# Patient Record
Sex: Male | Born: 1960 | Race: White | Hispanic: No | Marital: Married | Smoking: Current every day smoker
Health system: Southern US, Community
[De-identification: ages and names within clinical notes are randomized; demographics above are authoritative.]

## PROBLEM LIST (undated history)

## (undated) DIAGNOSIS — M79671 Pain in right foot: Secondary | ICD-10-CM

## (undated) DIAGNOSIS — G8929 Other chronic pain: Secondary | ICD-10-CM

## (undated) DIAGNOSIS — E663 Overweight: Secondary | ICD-10-CM

## (undated) DIAGNOSIS — F329 Major depressive disorder, single episode, unspecified: Secondary | ICD-10-CM

## (undated) DIAGNOSIS — J449 Chronic obstructive pulmonary disease, unspecified: Secondary | ICD-10-CM

## (undated) DIAGNOSIS — M545 Low back pain, unspecified: Secondary | ICD-10-CM

## (undated) DIAGNOSIS — G4730 Sleep apnea, unspecified: Secondary | ICD-10-CM

## (undated) DIAGNOSIS — M1990 Unspecified osteoarthritis, unspecified site: Secondary | ICD-10-CM

## (undated) DIAGNOSIS — B019 Varicella without complication: Secondary | ICD-10-CM

## (undated) DIAGNOSIS — T7840XA Allergy, unspecified, initial encounter: Secondary | ICD-10-CM

## (undated) DIAGNOSIS — B354 Tinea corporis: Secondary | ICD-10-CM

## (undated) DIAGNOSIS — I10 Essential (primary) hypertension: Secondary | ICD-10-CM

## (undated) DIAGNOSIS — R5383 Other fatigue: Principal | ICD-10-CM

## (undated) DIAGNOSIS — M542 Cervicalgia: Secondary | ICD-10-CM

## (undated) DIAGNOSIS — E349 Endocrine disorder, unspecified: Secondary | ICD-10-CM

## (undated) DIAGNOSIS — F419 Anxiety disorder, unspecified: Secondary | ICD-10-CM

## (undated) DIAGNOSIS — E785 Hyperlipidemia, unspecified: Secondary | ICD-10-CM

## (undated) DIAGNOSIS — L739 Follicular disorder, unspecified: Secondary | ICD-10-CM

## (undated) DIAGNOSIS — Z720 Tobacco use: Secondary | ICD-10-CM

## (undated) DIAGNOSIS — M79672 Pain in left foot: Secondary | ICD-10-CM

## (undated) HISTORY — DX: Unspecified osteoarthritis, unspecified site: M19.90

## (undated) HISTORY — DX: Major depressive disorder, single episode, unspecified: F32.9

## (undated) HISTORY — DX: Tobacco use: Z72.0

## (undated) HISTORY — DX: Low back pain: M54.5

## (undated) HISTORY — DX: Varicella without complication: B01.9

## (undated) HISTORY — DX: Low back pain, unspecified: M54.50

## (undated) HISTORY — DX: Allergy, unspecified, initial encounter: T78.40XA

## (undated) HISTORY — DX: Follicular disorder, unspecified: L73.9

## (undated) HISTORY — DX: Anxiety disorder, unspecified: F41.9

## (undated) HISTORY — DX: Endocrine disorder, unspecified: E34.9

## (undated) HISTORY — DX: Pain in right foot: M79.671

## (undated) HISTORY — DX: Other chronic pain: G89.29

## (undated) HISTORY — DX: Other fatigue: R53.83

## (undated) HISTORY — DX: Sleep apnea, unspecified: G47.30

## (undated) HISTORY — DX: Cervicalgia: M54.2

## (undated) HISTORY — DX: Overweight: E66.3

## (undated) HISTORY — DX: Hyperlipidemia, unspecified: E78.5

## (undated) HISTORY — DX: Essential (primary) hypertension: I10

## (undated) HISTORY — DX: Chronic obstructive pulmonary disease, unspecified: J44.9

## (undated) HISTORY — DX: Tinea corporis: B35.4

## (undated) HISTORY — DX: Pain in left foot: M79.672

---

## 1999-11-10 ENCOUNTER — Ambulatory Visit (HOSPITAL_COMMUNITY): Admission: RE | Admit: 1999-11-10 | Discharge: 1999-11-10 | Payer: Self-pay | Admitting: Neurosurgery

## 1999-11-10 ENCOUNTER — Encounter: Payer: Self-pay | Admitting: Neurosurgery

## 1999-11-28 ENCOUNTER — Ambulatory Visit (HOSPITAL_COMMUNITY): Admission: RE | Admit: 1999-11-28 | Discharge: 1999-11-28 | Payer: Self-pay | Admitting: Neurosurgery

## 1999-11-28 ENCOUNTER — Encounter: Payer: Self-pay | Admitting: Neurosurgery

## 1999-12-12 ENCOUNTER — Ambulatory Visit (HOSPITAL_COMMUNITY): Admission: RE | Admit: 1999-12-12 | Discharge: 1999-12-12 | Payer: Self-pay | Admitting: Neurosurgery

## 1999-12-12 ENCOUNTER — Encounter: Payer: Self-pay | Admitting: Neurosurgery

## 2006-09-15 ENCOUNTER — Emergency Department (HOSPITAL_COMMUNITY): Admission: EM | Admit: 2006-09-15 | Discharge: 2006-09-15 | Payer: Self-pay | Admitting: Emergency Medicine

## 2011-06-14 ENCOUNTER — Telehealth: Payer: Self-pay | Admitting: Family Medicine

## 2011-06-14 ENCOUNTER — Encounter: Payer: Self-pay | Admitting: Family Medicine

## 2011-06-14 ENCOUNTER — Ambulatory Visit (INDEPENDENT_AMBULATORY_CARE_PROVIDER_SITE_OTHER): Payer: 59 | Admitting: Family Medicine

## 2011-06-14 VITALS — BP 145/95 | HR 129 | Temp 98.1°F | Ht 67.75 in | Wt 221.1 lb

## 2011-06-14 DIAGNOSIS — J449 Chronic obstructive pulmonary disease, unspecified: Secondary | ICD-10-CM

## 2011-06-14 DIAGNOSIS — Z23 Encounter for immunization: Secondary | ICD-10-CM

## 2011-06-14 DIAGNOSIS — J4489 Other specified chronic obstructive pulmonary disease: Secondary | ICD-10-CM

## 2011-06-14 DIAGNOSIS — E119 Type 2 diabetes mellitus without complications: Secondary | ICD-10-CM | POA: Insufficient documentation

## 2011-06-14 DIAGNOSIS — M545 Low back pain, unspecified: Secondary | ICD-10-CM

## 2011-06-14 DIAGNOSIS — B354 Tinea corporis: Secondary | ICD-10-CM

## 2011-06-14 DIAGNOSIS — M542 Cervicalgia: Secondary | ICD-10-CM

## 2011-06-14 DIAGNOSIS — F17200 Nicotine dependence, unspecified, uncomplicated: Secondary | ICD-10-CM

## 2011-06-14 DIAGNOSIS — E663 Overweight: Secondary | ICD-10-CM

## 2011-06-14 DIAGNOSIS — E785 Hyperlipidemia, unspecified: Secondary | ICD-10-CM

## 2011-06-14 DIAGNOSIS — Z720 Tobacco use: Secondary | ICD-10-CM

## 2011-06-14 DIAGNOSIS — G8929 Other chronic pain: Secondary | ICD-10-CM

## 2011-06-14 DIAGNOSIS — I10 Essential (primary) hypertension: Secondary | ICD-10-CM

## 2011-06-14 DIAGNOSIS — E559 Vitamin D deficiency, unspecified: Secondary | ICD-10-CM | POA: Insufficient documentation

## 2011-06-14 DIAGNOSIS — T7840XA Allergy, unspecified, initial encounter: Secondary | ICD-10-CM

## 2011-06-14 HISTORY — DX: Overweight: E66.3

## 2011-06-14 HISTORY — DX: Tobacco use: Z72.0

## 2011-06-14 HISTORY — DX: Low back pain, unspecified: M54.50

## 2011-06-14 HISTORY — DX: Chronic obstructive pulmonary disease, unspecified: J44.9

## 2011-06-14 HISTORY — DX: Essential (primary) hypertension: I10

## 2011-06-14 HISTORY — DX: Other chronic pain: G89.29

## 2011-06-14 HISTORY — DX: Allergy, unspecified, initial encounter: T78.40XA

## 2011-06-14 MED ORDER — CARISOPRODOL 350 MG PO TABS: 350.0000 mg | ORAL_TABLET | Freq: Three times a day (TID) | ORAL | Status: AC | PRN

## 2011-06-14 MED ORDER — OXYCODONE-ACETAMINOPHEN 10-325 MG PO TABS: 1.0000 | ORAL_TABLET | Freq: Four times a day (QID) | ORAL | Status: DC | PRN

## 2011-06-14 MED ORDER — METOPROLOL SUCCINATE ER 25 MG PO TB24: 25.0000 mg | ORAL_TABLET | Freq: Every day | ORAL | Status: DC

## 2011-06-14 NOTE — Patient Instructions (Signed)
Preventative Care for Adults, Male A healthy lifestyle and preventative care can promote health and wellness. Preventative health guidelines for men include the following key practices:  A routine yearly physical is a good way to check with your caregiver about your health and preventative screening. It is a chance to share any concerns and updates on your health, and to receive a thorough exam.   Visit your dentist for a routine exam and preventative care every 6 months. Brush your teeth twice a day and floss once a day. Good oral hygiene prevents tooth decay and gum disease.   The frequency of eye exams is based on your age, health, family medical history, use of contact lenses, and other factors. Follow your caregiver's recommendations for frequency of eye exams.   Eat a healthy diet. Foods like vegetables, fruits, whole grains, low-fat dairy products, and lean protein foods contain the nutrients you need without too many calories. Decrease your intake of foods high in solid fats, added sugars, and salt. Eat the right amount of calories for you.Get information about a proper diet from your caregiver, if necessary.   Regular physical exercise is one of the most important things you can do for your health. Most adults should get at least 150 minutes of moderate-intensity exercise (any activity that increases your heart rate and causes you to sweat) each week. In addition, most adults need muscle-strengthening exercises on 2 or more days a week.   Maintain a healthy weight. The body mass index (BMI) is a screening tool to identify possible weight problems. It provides an estimate of body fat based on height and weight. Your caregiver can help determine your BMI, and can help you achieve or maintain a healthy weight.For adults 20 years and older:   A BMI below 18.5 is considered underweight.   A BMI of 18.5 to 24.9 is normal.   A BMI of 25 to 29.9 is considered overweight.   A BMI of 30 and  above is considered obese.   Maintain normal blood lipids and cholesterol levels by exercising and minimizing your intake of saturated fat. Eat a balanced diet with plenty of fruit and vegetables. Blood tests for lipids and cholesterol should begin at age 28 and be repeated every 5 years. If your lipid or cholesterol levels are high, you are over 50, or you are a high risk for heart disease, you may need your cholesterol levels checked more frequently.Ongoing high lipid and cholesterol levels should be treated with medicines if diet and exercise are not effective.   If you smoke, find out from your caregiver how to quit. If you do not use tobacco, do not start.   If you choose to drink alcohol, do not exceed 2 drinks per day. One drink is considered to be 12 ounces (355 mL) of beer, 5 ounces (148 mL) of wine, or 1.5 ounces (44 mL) of liquor.   Avoid use of street drugs. Do not share needles with anyone. Ask for help if you need support or instructions about stopping the use of drugs.   High blood pressure causes heart disease and increases the risk of stroke. Your blood pressure should be checked at least every 1 to 2 years. Ongoing high blood pressure should be treated with medicines, if weight loss and exercise are not effective.   If you are 75 to 49 years old, ask your caregiver if you should take aspirin to prevent heart disease.   Diabetes screening involves taking a blood  sample to check your fasting blood sugar level. This should be done once every 3 years, after age 9, if you are within normal weight and without risk factors for diabetes. Testing should be considered at a younger age or be carried out more frequently if you are overweight and have at least 1 risk factor for diabetes.   Colorectal cancer can be detected and often prevented. Most routine colorectal cancer screening begins at the age of 72 and continues through age 66. However, your caregiver may recommend screening at an  earlier age if you have risk factors for colon cancer. On a yearly basis, your caregiver may provide home test kits to check for hidden blood in the stool. Use of a small camera at the end of a tube, to directly examine the colon (sigmoidoscopy or colonoscopy), can detect the earliest forms of colorectal cancer. Talk to your caregiver about this at age 64, when routine screening begins. Direct examination of the colon should be repeated every 5 to 10 years through age 28, unless early forms of pre-cancerous polyps or small growths are found.   Practice safe sex. Use condoms and avoid high-risk sexual practices to reduce the spread of sexually transmitted infections (STIs). STIs include gonorrhea, chlamydia, syphilis, trichomonas, herpes, HPV, and human immunodeficiency virus (HIV). Herpes, HIV, and HPV are viral illnesses that have no cure. They can result in disability, cancer, and death.   A one-time screening for abdominal aortic aneurysm (AAA) and surgical repair of large AAAs by sound wave imaging (ultrasonography) is recommended for ages 37 to 51 years who are current or former smokers.   Healthy men should no longer receive prostate-specific antigen (PSA) blood tests as part of routine cancer screening. Consult with your caregiver about prostate cancer screening.   Use sunscreen with skin protection factor (SPF) of 30 or more. Apply sunscreen liberally and repeatedly throughout the day. You should seek shade when your shadow is shorter than you. Protect yourself by wearing long sleeves, pants, a wide-brimmed hat, and sunglasses year round, whenever you are outdoors.   Once a month, do a whole body skin exam, using a mirror to look at the skin on your back. Notify your caregiver of new moles, moles that have irregular borders, moles that are larger than a pencil eraser, or moles that have changed in shape or color.   Stay current with required immunizations.   Influenza. You need a dose every  fall (or winter). The composition of the flu vaccine changes each year, so being vaccinated once is not enough.   Pneumococcal polysaccharide. You need 1 to 2 doses if you smoke cigarettes or if you have certain chronic medical conditions. You need 1 dose at age 80 (or older) if you have never been vaccinated.   Tetanus, diphtheria, pertussis (Tdap, Td). Get 1 dose of Tdap vaccine if you are younger than age 95 years, are over 17 and have contact with an infant, are a Research scientist (physical sciences), or simply want to be protected from whooping cough. After that, you need a Td booster dose every 10 years. Consult your caregiver if you have not had at least 3 tetanus and diphtheria-containing shots sometime in your life or have a deep or dirty wound.   HPV. This vaccine is recommended for males 13 through 49 years of age. This vaccine may be given to men 22 through 49 years of age who have not completed the 3 dose series. It is recommended for men through age 48  who have sex with men or whose immune system is weakened because of HIV infection, other illness, or medications. The vaccine is given in 3 doses over 6 months.   Measles, mumps, rubella (MMR). You need at least 1 dose of MMR if you were born in 1957 or later. You may also need a 2nd dose.   Meningococcal. If you are age 33 to 73 years and a Orthoptist living in a residence hall, or have one of several medical conditions, you need to get vaccinated against meningococcal disease. You may also need additional booster doses.   Zoster (shingles). If you are age 26 years or older, you should get this vaccine.   Varicella (chickenpox). If you have never had chickenpox or you were vaccinated but received only 1 dose, talk to your caregiver to find out if you need this vaccine.   Hepatitis A. You need this vaccine if you have a specific risk factor for hepatitis A virus infection, or you simply wish to be protected from this disease. The vaccine is  usually given as 2 doses, 6 to 18 months apart.   Hepatitis B. You need this vaccine if you have a specific risk factor for hepatitis B virus infection or you simply wish to be protected from this disease. The vaccine is given in 3 doses, usually over 6 months.  Preventative Service / Frequency Ages 80 to 50  Blood pressure check.** / Every 1 to 2 years.   Lipid and cholesterol check.**/ Every 5 years beginning at age 70.   Skin self-exam. / Monthly.   Influenza immunization.** / Every year.   Pneumococcal polysaccharide immunization.** / 1 to 2 doses if you smoke cigarettes or if you have certain chronic medical conditions.   Tetanus, diphtheria, pertussis (Tdap,Td) immunization. / A one-time dose of Tdap vaccine. After that, you need a Td booster dose every 10 years.   HPV immunization. / 3 doses over 6 months, if 26 and younger.   Measles, mumps, rubella (MMR) immunization. / You need at least 1 dose of MMR if you were born in 1957 or later. You may also need a 2nd dose.   Meningococcal immunization. / 1 dose if you are age 64 to 39 years and a Orthoptist living in a residence hall, or have one of several medical conditions, you need to get vaccinated against meningococcal disease. You may also need additional booster doses.   Varicella immunization. **/ Consult your caregiver.   Hepatitis A immunization. ** / Consult your caregiver. 2 doses, 6 to 18 months apart.   Hepatitis B immunization.** / Consult your caregiver. 3 doses usually over 6 months.  Ages 42 to 63  Blood pressure check.** / Every 1 to 2 years.   Lipid and cholesterol check.**/ Every 5 years beginning at age 58.   Fecal occult blood test (FOBT) of stool. / Every year beginning at age 64 and continuing until age 64. You may not have to do this test if you get colonoscopy every 10 years.   Flexible sigmoidoscopy** or colonoscopy.** / Every 5 years for a flexible sigmoidoscopy or every 10 years for  a colonoscopy beginning at age 70 and continuing until age 38.   Skin self-exam. / Monthly.   Influenza immunization.** / Every year.   Pneumococcal polysaccharide immunization.** / 1 to 2 doses if you smoke cigarettes or if you have certain chronic medical conditions.   Tetanus, diphtheria, pertussis (Tdap/Td) immunization.** / A one-time dose of  Tdap vaccine. After that, you need a Td booster dose every 10 years.   Measles, mumps, rubella (MMR) immunization. / You need at least 1 dose of MMR if you were born in 1957 or later. You may also need a 2nd dose.   Varicella immunization. **/ Consult your caregiver.   Meningococcal immunization.** / Consult your caregiver.   Hepatitis A immunization. ** / Consult your caregiver. 2 doses, 6 to 18 months apart.   Hepatitis B immunization.** / Consult your caregiver. 3 doses, usually over 6 months.  Ages 75 and over  Blood pressure check.** / Every 1 to 2 years.   Lipid and cholesterol check.**/ Every 5 years beginning at age 40.   Fecal occult blood test (FOBT) of stool. / Every year beginning at age 38 and continuing until age 30. You may not have to do this test if you get colonoscopy every 10 years.   Flexible sigmoidoscopy** or colonoscopy.** / Every 5 years for a flexible sigmoidoscopy or every 10 years for a colonoscopy beginning at age 89 and continuing until age 48.   Abdominal aortic aneurysm (AAA) screening.** / A one-time screening for ages 79 to 74 years who are current or former smokers.   Skin self-exam. / Monthly.   Influenza immunization.** / Every year.   Pneumococcal polysaccharide immunization.** / 1 dose at age 49 (or older) if you have never been vaccinated.   Tetanus, diphtheria, pertussis (Tdap, Td) immunization. / A one-time dose of Tdap vaccine if you are over 65 and have contact with an infant, are a Research scientist (physical sciences), or simply want to be protected from whooping cough. After that, you need a Td booster dose  every 10 years.   Varicella immunization. **/ Consult your caregiver.   Meningococcal immunization.** / Consult your caregiver.   Hepatitis A immunization. ** / Consult your caregiver. 2 doses, 6 to 18 months apart.   Hepatitis B immunization.** / Check with your caregiver. 3 doses, usually over 6 months.  **Family history and personal history of risk and conditions may change your caregiver's recommendations. Document Released: 09/26/2001 Document Revised: 04/12/2011 Document Reviewed: 12/26/2010 [facility] Patient Information 2012 [place], [place].  Try MegaRed caps instead of plain fish oil daily.  Try ice and aspercreme for knee pain  Minimize caffeine

## 2011-06-14 NOTE — Telephone Encounter (Signed)
Please advise 

## 2011-06-15 NOTE — Telephone Encounter (Signed)
Fine with me to provide him with requested note.

## 2011-06-15 NOTE — Telephone Encounter (Signed)
Letter faxed and left a detailed message on patients cell phone

## 2011-06-20 ENCOUNTER — Encounter: Payer: Self-pay | Admitting: Family Medicine

## 2011-06-20 DIAGNOSIS — B354 Tinea corporis: Secondary | ICD-10-CM

## 2011-06-20 HISTORY — DX: Tinea corporis: B35.4

## 2011-06-20 MED ORDER — FLUCONAZOLE 150 MG PO TABS: ORAL_TABLET | ORAL | Status: DC

## 2011-06-20 MED ORDER — KETOCONAZOLE 2 % EX SHAM: MEDICATED_SHAMPOO | CUTANEOUS | Status: AC

## 2011-06-20 NOTE — Progress Notes (Signed)
CAMEO SHEWELL [id number] [birth year] 06/20/2011      Progress Note New Patient  Subjective  Chief Complaint  Chief Complaint  Patient presents with  . Establish Care    new patient    HPI  Patient is a 50 year old Caucasian male who is in today for new patient appointment. He has been struggling with back pain for over 10 years. MRI in 1999 revealed degenerative disc disease bulging discs in both his neck and his low back. Arthritic changes were also noted. 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 Recently the pain is worsened and he has been in the ER couple times to receive medications he is here to establish care and to receive a referral to pain management for further assistance. He gets some temporary relief from ibuprofen and Percocet but symptoms recurred. He gets some temporary relief by lying on his side on the left. His radicular pain down his left arm as well as some radicular symptoms down both legs. No incontinence. No recent illness, fevers, chills, chest pain, palpitations, shortness of breath. He has a rash on her trunk which has been present for years it is pruritic.  Past Medical History  Diagnosis Date  . Allergy     seasonal  . Diabetes mellitus 7-12  . Hypertension   . Chronic lower back pain   . Chicken pox as a teenager  . Arthritis   . Hyperlipidemia   . Low back pain 06/14/2011  . Neck pain, chronic 06/14/2011  . Overweight 06/14/2011  . HTN (hypertension) 06/14/2011  . Tobacco abuse 06/14/2011  . 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 Diabetes mellitus 06/14/2011  . Allergic state 06/14/2011  . Vitamin D deficiency   . Tinea corporis 06/20/2011    History reviewed. No pertinent past surgical history.  Family History  Problem Relation Age of Onset  . Hyperlipidemia Mother   . Diabetes Mother     type 2  . Thyroid disease  Mother   . Arthritis Mother     crippling  . Hyperlipidemia Father   . Cancer Father     gum/ smoked cigars  . Arthritis Sister     rheumatoid  . Other Sister     heart arrythmia  . Heart disease Sister   . Other Sister     bad back  . Arthritis Sister     chronic    History   Social History  . Marital Status: Married    Spouse Name: N/A    Number of Children: N/A  . Years of Education: N/A   Occupational History  . Not on file.   Social History Main Topics  . Smoking status: Current Everyday Smoker -- 1.0 packs/day for 30 years    Types: Cigarettes  . Smokeless tobacco: Never Used  . Alcohol Use: Yes     12 pack of beer a week  . Drug Use: No  . Sexually Active: Yes -- Male partner(s)   Other Topics Concern  . Not on file   Social History Narrative  . No narrative on file    No current outpatient prescriptions on file prior to visit.    No Known Allergies  Review of Systems  Review of Systems  Constitutional: Negative for fever, chills and malaise/fatigue.  HENT: Negative for hearing loss, nosebleeds and congestion.        [facility] with [facility]  Eyes: Negative for discharge.  Respiratory: Negative for cough, sputum production, shortness of breath and wheezing.   Cardiovascular: Negative for chest pain, palpitations and leg swelling.  Gastrointestinal: Negative for heartburn, nausea, vomiting, abdominal pain, diarrhea, constipation and blood in stool.  Genitourinary: Negative for dysuria, urgency, frequency and hematuria.  Musculoskeletal: Positive for back pain and joint pain. Negative for myalgias and falls.       Pain is worsening in low back, he can get some relief if he lies on his left side but the relief is temporary. He has pain that radiates down his left arm from his neck.   Skin: Positive for itching and rash.       On trunk  Neurological: Negative for dizziness, tremors, sensory change,  focal weakness, loss of consciousness, weakness and headaches.  Endo/Heme/Allergies: Negative for polydipsia. Does not bruise/bleed easily.  Psychiatric/Behavioral: Negative for depression and suicidal ideas. The patient is not nervous/anxious and does not have insomnia.     Objective  BP 145/95  Pulse 129  Temp(Src) 98.1 F (36.7 C) (Oral)  Ht 5' 7.75" (1.721 m)  Wt 221 lb 1.9 oz (100.299 kg)  BMI 33.87 kg/m2  SpO2 98%  Physical Exam  Physical Exam  Constitutional: He is oriented to person, place, and time and well-developed, well-nourished, and in no distress. No distress.  HENT:  Head: Normocephalic and atraumatic.  Eyes: Conjunctivae are normal.  Neck: Neck supple. No thyromegaly present.  Cardiovascular: Normal rate, regular rhythm and normal heart sounds.   No murmur heard. Pulmonary/Chest: Effort normal and breath sounds normal. No respiratory distress.  Abdominal: He exhibits no distension and no mass. There is no tenderness.  Musculoskeletal: He exhibits tenderness. He exhibits no edema.       Pain with palp over lumbosacral spine, along paravertebral muscles.  Neurological: He is alert and oriented to person, place, and time.  Skin: Skin is warm. Rash noted. There is erythema.       Rash is diffuse, raised and scaly on his back  Psychiatric: Memory, affect and judgment normal.       Assessment & Plan  Low back pain Patient with long history of low back pain, he reports chronic low back pain and an MRI in 1999 showing DDD, 2 bugling discs and arthritic changes. He also reprots neck pain and DDD in his neck. He has had a steroid injection in his low back in past was helpful. He has been seeking crisis pain management in the ER recently and is here today to establish care and is requesting a referral to pain management to an office near his home. He is given a refill on his percocet to use sparingly. He is also giving him some Soma to use prn as well.  HTN  (hypertension) Elevated today. Encouraged to minimize his sodium intake and we will reassess at his next visit.   Diabetes mellitus Taking Metformin and no c/o concerning symptoms, will monitor hgba1c with patient. Minimize simple carbs.  Tobacco abuse Smoking 1 ppd per day encouraged to try to cut back and quit.  Overweight Consider dash diet and minimize po intake and simple carbs  Tinea corporis Has had trouble with intermittent pruritic rash on his back for years. Has responded partially to antifungals in past. Will try dual treament with Diflucan and Nizoral shampoo

## 2011-06-20 NOTE — Assessment & Plan Note (Signed)
Has had trouble with intermittent pruritic rash on his back for years. Has responded partially to antifungals in past. Will try dual treament with Diflucan and Nizoral shampoo

## 2011-06-20 NOTE — Assessment & Plan Note (Signed)
Smoking 1 ppd per day encouraged to try to cut back and quit.

## 2011-06-20 NOTE — Assessment & Plan Note (Signed)
Elevated today. Encouraged to minimize his sodium intake and we will reassess at his next visit.

## 2011-06-20 NOTE — Assessment & Plan Note (Signed)
Taking Metformin and no c/o concerning symptoms, will monitor hgba1c with patient. Minimize simple carbs.

## 2011-06-20 NOTE — Assessment & Plan Note (Signed)
Patient with long history of low back pain, he reports chronic low back pain and an MRI in 1999 showing DDD, 2 bugling discs and arthritic changes. He also reprots neck pain and DDD in his neck. He has had a steroid injection in his low back in past was helpful. He has been seeking crisis pain management in the ER recently and is here today to establish care and is requesting a referral to pain management to an office near his home. He is given a refill on his percocet to use sparingly. He is also giving him some Soma to use prn as well.

## 2011-06-20 NOTE — Assessment & Plan Note (Signed)
Consider dash diet and minimize po intake and simple carbs

## 2011-07-10 ENCOUNTER — Other Ambulatory Visit: Payer: Self-pay | Admitting: Family Medicine

## 2011-07-10 DIAGNOSIS — M545 Low back pain: Secondary | ICD-10-CM

## 2011-07-10 DIAGNOSIS — G8929 Other chronic pain: Secondary | ICD-10-CM

## 2011-07-10 MED ORDER — OXYCODONE-ACETAMINOPHEN 10-325 MG PO TABS: 1.0000 | ORAL_TABLET | Freq: Four times a day (QID) | ORAL | Status: DC | PRN

## 2011-07-10 NOTE — Telephone Encounter (Signed)
Pt informed that RX is ready to pick up.

## 2011-07-10 NOTE — Telephone Encounter (Signed)
Please advise refill on Percocet?

## 2011-07-13 ENCOUNTER — Ambulatory Visit (INDEPENDENT_AMBULATORY_CARE_PROVIDER_SITE_OTHER): Payer: 59 | Admitting: Family Medicine

## 2011-07-13 ENCOUNTER — Encounter: Payer: Self-pay | Admitting: Family Medicine

## 2011-07-13 DIAGNOSIS — R5383 Other fatigue: Secondary | ICD-10-CM | POA: Insufficient documentation

## 2011-07-13 DIAGNOSIS — L739 Follicular disorder, unspecified: Secondary | ICD-10-CM

## 2011-07-13 DIAGNOSIS — R0681 Apnea, not elsewhere classified: Secondary | ICD-10-CM

## 2011-07-13 DIAGNOSIS — B354 Tinea corporis: Secondary | ICD-10-CM

## 2011-07-13 DIAGNOSIS — F17200 Nicotine dependence, unspecified, uncomplicated: Secondary | ICD-10-CM

## 2011-07-13 DIAGNOSIS — J449 Chronic obstructive pulmonary disease, unspecified: Secondary | ICD-10-CM

## 2011-07-13 DIAGNOSIS — L738 Other specified follicular disorders: Secondary | ICD-10-CM

## 2011-07-13 DIAGNOSIS — Z720 Tobacco use: Secondary | ICD-10-CM

## 2011-07-13 DIAGNOSIS — E119 Type 2 diabetes mellitus without complications: Secondary | ICD-10-CM

## 2011-07-13 DIAGNOSIS — I10 Essential (primary) hypertension: Secondary | ICD-10-CM

## 2011-07-13 DIAGNOSIS — M545 Low back pain: Secondary | ICD-10-CM

## 2011-07-13 HISTORY — DX: Other fatigue: R53.83

## 2011-07-13 HISTORY — DX: Follicular disorder, unspecified: L73.9

## 2011-07-13 MED ORDER — DOXYCYCLINE HYCLATE 100 MG PO TABS: 100.0000 mg | ORAL_TABLET | Freq: Two times a day (BID) | ORAL | Status: AC

## 2011-07-13 NOTE — Assessment & Plan Note (Signed)
Snoring, witnessed apnea, weight gain, HTN. Patient agrees to an in home sleep study for further evaluation

## 2011-07-13 NOTE — Progress Notes (Signed)
Scott Sosa [id number] [birth year] 07/13/2011      Progress Note-Follow Up  Subjective  Chief Complaint  Chief Complaint  Patient presents with  . Follow-up    1 month    HPI  Patient is a 50 yo caucasian male in today for follow up on medical problems. He has been checking his blood sugars in the 2 clusters fasting are ranging from 109-126. He denies polyuria or polydipsia. He has an eye exam scheduled for this week and will have results forwarded. 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 Etiologies being excessively fatigued and is trying to getting hours of sleep. He also now is waking frequently and snoring excessively. His wife is also noting that he sometimes stops breathing. He's never had a sleep study evaluation thus far. Reports his blood pressure before coming in today was 125/86. Reports the rash on his back is largely resolved with Nizoral and Diflucan but he does have 1 persistent irritated itching lesion in the center of his back which is roughly located rigidity quite several months ago. No fevers, chills, chest pain, palpitations, shortness of breath, headache, GI or GU complaints. He has been struggling with back pain for many years he has been allowed a Percocet about 2-3 tablets daily and has managed to keep it at work. Does not cause sedation. He's not getting worse since starting this medication. He has been using tears overall her sleep and that has been helpful as well. He has an appointment with pain management on 07/17/2011 for further evaluation.  Past Medical History  Diagnosis Date  . Allergy     seasonal  . Diabetes mellitus 7-12  . Hypertension   . Chronic lower back pain   . Chicken pox as a teenager  . Arthritis   . Hyperlipidemia   . Low back pain 06/14/2011  . Neck pain, chronic 06/14/2011  . Overweight 06/14/2011  . HTN (hypertension) 06/14/2011  . Tobacco abuse 06/14/2011  . 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 Diabetes  mellitus 06/14/2011  . Allergic state 06/14/2011  . Vitamin D deficiency   . Tinea corporis 06/20/2011     Family History  Problem Relation Age of Onset  . Hyperlipidemia Mother   . Diabetes Mother     type 2  . Thyroid disease Mother   . Arthritis Mother     crippling  . Hyperlipidemia Father   . Cancer Father     gum/ smoked cigars  . Arthritis Sister     rheumatoid  . Other Sister     heart arrythmia  . Heart disease Sister   . Other Sister     bad back  . Arthritis Sister     chronic    History   Social History  . Marital Status: Married    Spouse Name: N/A    Number of Children: N/A  . Years of Education: N/A   Occupational History  . Not on file.   Social History Main Topics  . Smoking status: Current Everyday Smoker -- 1.0 packs/day for 30 years    Types: Cigarettes  . Smokeless tobacco: Never Used  . Alcohol Use: Yes     12 pack of beer a week  . Drug Use: No  . Sexually Active: Yes -- Male partner(s)   Other Topics Concern  . Not on file   Social History Narrative  . No narrative on file    Current Outpatient Prescriptions on File Prior  to Visit  Medication Sig Dispense Refill  . amLODipine (NORVASC) 10 MG tablet Take 5 mg by mouth daily.       . Cholecalciferol 1000 UNITS tablet Take 1,000 Units by mouth daily.        Marland Kitchen ketoconazole (NIZORAL) 2 % shampoo Apply topically 2 (two) times a week. To affected areas  120 mL  1  . lisinopril-hydrochlorothiazide (PRINZIDE,ZESTORETIC) 20-25 MG per tablet Take 1 tablet by mouth daily.        Marland Kitchen loratadine (CLARITIN) 10 MG tablet Take 10 mg by mouth daily.        . metFORMIN (GLUCOPHAGE) 500 MG tablet Take 500 mg by mouth 2 (two) times daily with a meal.        . metoprolol succinate (TOPROL XL) 25 MG 24 hr tablet Take 1 tablet (25 mg total) by mouth daily.  30 tablet  11  . niacin (SLO-NIACIN) 500 MG tablet Take 1,000 mg by mouth at bedtime.       Marland Kitchen oxyCODONE-acetaminophen (PERCOCET) 10-325 MG per  tablet Take 1 tablet by mouth every 6 (six) hours as needed for pain.  60 tablet  0    No Known Allergies  Review of Systems  Review of Systems  Constitutional: Positive for malaise/fatigue. Negative for fever.  HENT: Negative for congestion.   Eyes: Negative for discharge.  Respiratory: Negative for shortness of breath.   Cardiovascular: Negative for chest pain, palpitations and leg swelling.  Gastrointestinal: Negative for nausea, abdominal pain and diarrhea.  Genitourinary: Negative for dysuria.  Musculoskeletal: Negative for falls.  Skin: Positive for itching and rash.       Tinea corporis on back nearly resolved, but irritated, itchy lesion in middle of back is present for several months s/p a tick bite  Neurological: Negative for loss of consciousness and headaches.  Endo/Heme/Allergies: Negative for polydipsia.  Psychiatric/Behavioral: Negative for depression and suicidal ideas. The patient has insomnia. The patient is not nervous/anxious.     Objective  BP 148/97  Pulse 95  Ht 5' 7.75" (1.721 m)  Wt 226 lb (102.513 kg)  BMI 34.62 kg/m2  SpO2 97%  Physical Exam  Physical Exam  Constitutional: He is oriented to person, place, and time and well-developed, well-nourished, and in no distress. No distress.  HENT:  Head: Normocephalic and atraumatic.  Nose: Nose normal.  Mouth/Throat: Oropharynx is clear and moist.  Eyes: Conjunctivae are normal. No scleral icterus.  Neck: Neck supple. No thyromegaly present.  Cardiovascular: Normal rate, regular rhythm and normal heart sounds.   No murmur heard. Pulmonary/Chest: Effort normal and breath sounds normal. No respiratory distress.  Abdominal: Soft. Bowel sounds are normal. He exhibits no distension and no mass. There is no tenderness.  Musculoskeletal: He exhibits no edema.  Lymphadenopathy:    He has no cervical adenopathy.  Neurological: He is alert and oriented to person, place, and time.  Skin: Skin is warm. There  is erythema.       3 mm raised, firm erythematous follicular lesion  Psychiatric: Memory, affect and judgment normal.     Assessment & Plan  Tobacco abuse Needs to quit but his wife and he hope to quit together so he is noncommital today. Encouraged to try nicotine replacement products and/or Bupropion  Tinea corporis Greatly improved with Nizoral and Fluconazole  Folliculitis Patient notes a painful, itchy bump on his back about where he suffered a tick bite months ago. Will rx with Doxycycline bid and he is encouraged to cleanse  with Jeanann Lewandowsky Astringent daily  Diabetes mellitus Patient reports bs ranging from 109 to 126 in am on Metformin, avoid excessive carbs and patient declines labs today but agrees to labs at next visit  HTN (hypertension) Adequately controlled he reports his number athome before driving in was 161/09. No changes to meds.  COPD, mild Patient is counseled to stop smoking to stop the progression.  Low back pain Improved with Percocet 2-3 tabs daily and some Carisopradol qhs. Has appt with pain management on 12/3  Fatigue Snoring, witnessed apnea, weight gain, HTN. Patient agrees to an in home sleep study for further evaluation

## 2011-07-13 NOTE — Assessment & Plan Note (Signed)
Patient is counseled to stop smoking to stop the progression.

## 2011-07-13 NOTE — Assessment & Plan Note (Signed)
Improved with Percocet 2-3 tabs daily and some Carisopradol qhs. Has appt with pain management on 12/3

## 2011-07-13 NOTE — Assessment & Plan Note (Signed)
Greatly improved with Nizoral and Fluconazole

## 2011-07-13 NOTE — Assessment & Plan Note (Signed)
Patient reports bs ranging from 109 to 126 in am on Metformin, avoid excessive carbs and patient declines labs today but agrees to labs at next visit

## 2011-07-13 NOTE — Assessment & Plan Note (Signed)
Patient notes a painful, itchy bump on his back about where he suffered a tick bite months ago. Will rx with Doxycycline bid and he is encouraged to cleanse with Jeanann Lewandowsky Astringent daily

## 2011-07-13 NOTE — Patient Instructions (Signed)
Folliculitis  Folliculitis is an infection and inflammation of the hair follicles. Hair follicles become red and irritated. This inflammation is usually caused by bacteria. The bacteria thrive in warm, moist environments. This condition can be seen anywhere on the body.  CAUSES The most common cause of folliculitis is an infection by germs (bacteria). Fungal and viral infections can also cause the condition. Viral infections may be more common in people whose bodies are unable to fight disease well (weakened immune systems). Examples include people with:  AIDS.   An organ transplant.   Cancer.  People with depressed immune systems, diabetes, or obesity, have a greater risk of getting folliculitis than the general population. Certain chemicals, especially oils and tars, also can cause folliculitis. SYMPTOMS  An early sign of folliculitis is a small, white or yellow pus-filled, itchy lesion (pustule). These lesions appear on a red, inflamed follicle. They are usually less than 5 mm (.20 inches).   The most likely starting points are the scalp, thighs, legs, back and buttocks. Folliculitis is also frequently found in areas of repeated shaving.   When an infection of the follicle goes deeper, it becomes a boil or furuncle. A group of closely packed boils create a larger lesion (a carbuncle). These sores (lesions) tend to occur in hairy, sweaty areas of the body.  TREATMENT   A doctor who specializes in skin problems (dermatologists) treats mild cases of folliculitis with antiseptic washes.   They also use a skin application which kills germs (topical antibiotics). Tea tree oil is a good topical antiseptic as well. It can be found at a health food store. A small percentage of individuals may develop an allergy to the tea tree oil.   Mild to moderate boils respond well to warm water compresses applied three times daily.   In some cases, oral antibiotics should be taken with the skin treatment.     If lesions contain large quantities of pus or fluid, your caregiver may drain them. This allows the topical antibiotics to get to the affected areas better.   Stubborn cases of folliculitis may respond to laser hair removal. This process uses a high intensity light beam (a laser) to destroy the follicle and reduces the scarring from folliculitis. After laser hair removal, hair will no longer grow in the laser treated area.  Patients with long-lasting folliculitis need to find out where the infection is coming from. Germs can live in the nostrils of the patient. This can trigger an outbreak now and then. Sometimes the bacteria live in the nostrils of a family member. This person does not develop the disorder but they repeatedly re-expose others to the germ. To break the cycle of recurrence in the patient, the family member must also undergo treatment. PREVENTION   Individuals who are predisposed to folliculitis should be extremely careful about personal hygiene.   Application of antiseptic washes may help prevent recurrences.   A topical antibiotic cream, mupirocin (Bactroban), has been effective at reducing bacteria in the nostrils. It is applied inside the nose with your little finger. This is done twice daily for a week. Then it is repeated every 6 months.   Because follicle disorders tend to come back, patients must receive follow-up care. Your caregiver may be able to recognize a recurrence before it becomes severe.  SEEK IMMEDIATE MEDICAL CARE IF:   You develop redness, swelling, or increasing pain in the area.   You have a fever.   You are not improving with treatment  or are getting worse.   You have any other questions or concerns.  Document Released: 10/09/2001 Document Revised: 04/12/2011 Document Reviewed: 08/05/2008 [facility] Patient Information 2012 [place], [place].   Start a yogurt and a probiotic such as align daily, while on antibiotics

## 2011-07-13 NOTE — Assessment & Plan Note (Addendum)
Needs to quit but his wife and he hope to quit together so he is noncommital today. Encouraged to try nicotine replacement products and/or Bupropion

## 2011-07-13 NOTE — Assessment & Plan Note (Signed)
Adequately controlled he reports his number athome before driving in was 086/57. No changes to meds.

## 2011-07-19 ENCOUNTER — Telehealth: Payer: Self-pay | Admitting: Family Medicine

## 2011-07-19 NOTE — Telephone Encounter (Signed)
FYI

## 2011-07-19 NOTE — Telephone Encounter (Signed)
Patient cancelled pain management appt & did not reschedule

## 2011-07-27 ENCOUNTER — Other Ambulatory Visit: Payer: Self-pay | Admitting: Family Medicine

## 2011-07-28 NOTE — Telephone Encounter (Signed)
Pt last seen 11.29.12. Pls advise.

## 2011-08-11 ENCOUNTER — Ambulatory Visit (INDEPENDENT_AMBULATORY_CARE_PROVIDER_SITE_OTHER): Payer: 59 | Admitting: Family Medicine

## 2011-08-11 ENCOUNTER — Encounter: Payer: Self-pay | Admitting: Family Medicine

## 2011-08-11 DIAGNOSIS — G8929 Other chronic pain: Secondary | ICD-10-CM

## 2011-08-11 DIAGNOSIS — J449 Chronic obstructive pulmonary disease, unspecified: Secondary | ICD-10-CM

## 2011-08-11 DIAGNOSIS — F17200 Nicotine dependence, unspecified, uncomplicated: Secondary | ICD-10-CM

## 2011-08-11 DIAGNOSIS — R5381 Other malaise: Secondary | ICD-10-CM

## 2011-08-11 DIAGNOSIS — M545 Low back pain, unspecified: Secondary | ICD-10-CM

## 2011-08-11 DIAGNOSIS — F341 Dysthymic disorder: Secondary | ICD-10-CM

## 2011-08-11 DIAGNOSIS — F329 Major depressive disorder, single episode, unspecified: Secondary | ICD-10-CM

## 2011-08-11 DIAGNOSIS — R5383 Other fatigue: Secondary | ICD-10-CM

## 2011-08-11 DIAGNOSIS — F411 Generalized anxiety disorder: Secondary | ICD-10-CM

## 2011-08-11 DIAGNOSIS — E119 Type 2 diabetes mellitus without complications: Secondary | ICD-10-CM

## 2011-08-11 DIAGNOSIS — J4489 Other specified chronic obstructive pulmonary disease: Secondary | ICD-10-CM

## 2011-08-11 DIAGNOSIS — F419 Anxiety disorder, unspecified: Secondary | ICD-10-CM

## 2011-08-11 DIAGNOSIS — F3289 Other specified depressive episodes: Secondary | ICD-10-CM

## 2011-08-11 DIAGNOSIS — I10 Essential (primary) hypertension: Secondary | ICD-10-CM

## 2011-08-11 DIAGNOSIS — M542 Cervicalgia: Secondary | ICD-10-CM

## 2011-08-11 DIAGNOSIS — Z720 Tobacco use: Secondary | ICD-10-CM

## 2011-08-11 MED ORDER — BUPROPION HCL ER (XL) 300 MG PO TB24: 300.0000 mg | ORAL_TABLET | Freq: Every day | ORAL | Status: DC

## 2011-08-11 MED ORDER — BUPROPION HCL ER (XL) 150 MG PO TB24: 150.0000 mg | ORAL_TABLET | Freq: Every day | ORAL | Status: DC

## 2011-08-11 MED ORDER — NICOTINE 10 MG IN INHA: 1.0000 | RESPIRATORY_TRACT | Status: AC | PRN

## 2011-08-11 MED ORDER — OXYCODONE-ACETAMINOPHEN 10-325 MG PO TABS: 1.0000 | ORAL_TABLET | Freq: Four times a day (QID) | ORAL | Status: DC | PRN

## 2011-08-11 NOTE — Assessment & Plan Note (Signed)
Patient is ready to try and quit, his wife just started Chantix last week and he is ready to try quitting.

## 2011-08-11 NOTE — Patient Instructions (Signed)
Smoking Cessation This document explains the best ways for you to quit smoking and new treatments to help. It lists new medicines that can double or triple your chances of quitting and quitting for good. It also considers ways to avoid relapses and concerns you may have about quitting, including weight gain. NICOTINE: A POWERFUL ADDICTION If you have tried to quit smoking, you know how hard it can be. It is hard because nicotine is a very addictive drug. For some people, it can be as addictive as heroin or cocaine. Usually, people make 2 or 3 tries, or more, before finally being able to quit. Each time you try to quit, you can learn about what helps and what hurts. Quitting takes hard work and a lot of effort, but you can quit smoking. QUITTING SMOKING IS ONE OF THE MOST IMPORTANT THINGS YOU WILL EVER DO.  You will live longer, feel better, and live better.   The impact on your body of quitting smoking is felt almost immediately:   Within 20 minutes, blood pressure decreases. Pulse returns to its normal level.   After 8 hours, carbon monoxide levels in the blood return to normal. Oxygen level increases.   After 24 hours, chance of heart attack starts to decrease. Breath, hair, and body stop smelling like smoke.   After 48 hours, damaged nerve endings begin to recover. Sense of taste and smell improve.   After 72 hours, the body is virtually free of nicotine. Bronchial tubes relax and breathing becomes easier.   After 2 to 12 weeks, lungs can hold more air. Exercise becomes easier and circulation improves.   Quitting will reduce your risk of having a heart attack, stroke, cancer, or lung disease:   After 1 year, the risk of coronary heart disease is cut in half.   After 5 years, the risk of stroke falls to the same as a nonsmoker.   After 10 years, the risk of lung cancer is cut in half and the risk of other cancers decreases significantly.   After 15 years, the risk of coronary heart  disease drops, usually to the level of a nonsmoker.   If you are pregnant, quitting smoking will improve your chances of having a healthy baby.   The people you live with, especially your children, will be healthier.   You will have extra money to spend on things other than cigarettes.  FIVE KEYS TO QUITTING Studies have shown that these 5 steps will help you quit smoking and quit for good. You have the best chances of quitting if you use them together: 1. Get ready.  2. Get support and encouragement.  3. Learn new skills and behaviors.  4. Get medicine to reduce your nicotine addiction and use it correctly.  5. Be prepared for relapse or difficult situations. Be determined to continue trying to quit, even if you do not succeed at first.  1. GET READY  Set a quit date.   Change your environment.   Get rid of ALL cigarettes, ashtrays, matches, and lighters in your home, car, and place of work.   Do not let people smoke in your home.   Review your past attempts to quit. Think about what worked and what did not.   Once you quit, do not smoke. NOT EVEN A PUFF!  2. GET SUPPORT AND ENCOURAGEMENT Studies have shown that you have a better chance of being successful if you have help. You can get support in many ways.  Tell   your family, friends, and coworkers that you are going to quit and need their support. Ask them not to smoke around you.   Talk to your caregivers (doctor, dentist, nurse, pharmacist, psychologist, and/or smoking counselor).   Get individual, group, or telephone counseling and support. The more counseling you have, the better your chances are of quitting. Programs are available at local hospitals and health centers. Call your local health department for information about programs in your area.   Spiritual beliefs and practices may help some smokers quit.   Quit meters are small computer programs online or downloadable that keep track of quit statistics, such as amount  of "quit-time," cigarettes not smoked, and money saved.   Many smokers find one or more of the many self-help books available useful in helping them quit and stay off tobacco.  3. LEARN NEW SKILLS AND BEHAVIORS  Try to distract yourself from urges to smoke. Talk to someone, go for a walk, or occupy your time with a task.   When you first try to quit, change your routine. Take a different route to work. Drink tea instead of coffee. Eat breakfast in a different place.   Do something to reduce your stress. Take a hot bath, exercise, or read a book.   Plan something enjoyable to do every day. Reward yourself for not smoking.   Explore interactive web-based programs that specialize in helping you quit.  4. GET MEDICINE AND USE IT CORRECTLY Medicines can help you stop smoking and decrease the urge to smoke. Combining medicine with the above behavioral methods and support can quadruple your chances of successfully quitting smoking. The [place]. Food and Drug Administration (FDA) has approved 7 medicines to help you quit smoking. These medicines fall into 3 categories.  Nicotine replacement therapy (delivers nicotine to your body without the negative effects and risks of smoking):   Nicotine gum: Available over-the-counter.   Nicotine lozenges: Available over-the-counter.   Nicotine inhaler: Available by prescription.   Nicotine nasal spray: Available by prescription.   Nicotine skin patches (transdermal): Available by prescription and over-the-counter.   Antidepressant medicine (helps people abstain from smoking, but how this works is unknown):   Bupropion sustained-release (SR) tablets: Available by prescription.   Nicotinic receptor partial agonist (simulates the effect of nicotine in your brain):   Varenicline tartrate tablets: Available by prescription.   Ask your caregiver for advice about which medicines to use and how to use them. Carefully read the information on the package.    Everyone who is trying to quit may benefit from using a medicine. If you are pregnant or trying to become pregnant, nursing an infant, you are under age 18, or you smoke fewer than 10 cigarettes per day, talk to your caregiver before taking any nicotine replacement medicines.   You should stop using a nicotine replacement product and call your caregiver if you experience nausea, dizziness, weakness, vomiting, fast or irregular heartbeat, mouth problems with the lozenge or gum, or redness or swelling of the skin around the patch that does not go away.   Do not use any other product containing nicotine while using a nicotine replacement product.   Talk to your caregiver before using these products if you have diabetes, heart disease, asthma, stomach ulcers, you had a recent heart attack, you have high blood pressure that is not controlled with medicine, a history of irregular heartbeat, or you have been prescribed medicine to help you quit smoking.  5. BE PREPARED FOR RELAPSE OR   DIFFICULT SITUATIONS  Most relapses occur within the first 3 months after quitting. Do not be discouraged if you start smoking again. Remember, most people try several times before they finally quit.   You may have symptoms of withdrawal because your body is used to nicotine. You may crave cigarettes, be irritable, feel very hungry, cough often, get headaches, or have difficulty concentrating.   The withdrawal symptoms are only temporary. They are strongest when you first quit, but they will go away within 10 to 14 days.  Here are some difficult situations to watch for:  Alcohol. Avoid drinking alcohol. Drinking lowers your chances of successfully quitting.   Caffeine. Try to reduce the amount of caffeine you consume. It also lowers your chances of successfully quitting.   Other smokers. Being around smoking can make you want to smoke. Avoid smokers.   Weight gain. Many smokers will gain weight when they quit, usually  less than 10 pounds. Eat a healthy diet and stay active. Do not let weight gain distract you from your main goal, quitting smoking. Some medicines that help you quit smoking may also help delay weight gain. You can always lose the weight gained after you quit.   Bad mood or depression. There are a lot of ways to improve your mood other than smoking.  If you are having problems with any of these situations, talk to your caregiver. SPECIAL SITUATIONS AND CONDITIONS Studies suggest that everyone can quit smoking. Your situation or condition can give you a special reason to quit.  Pregnant women/new mothers: By quitting, you protect your baby's health and your own.   Hospitalized patients: By quitting, you reduce health problems and help healing.   Heart attack patients: By quitting, you reduce your risk of a second heart attack.   Lung, head, and neck cancer patients: By quitting, you reduce your chance of a second cancer.   Parents of children and adolescents: By quitting, you protect your children from illnesses caused by secondhand smoke.  QUESTIONS TO THINK ABOUT Think about the following questions before you try to stop smoking. You may want to talk about your answers with your caregiver.  Why do you want to quit?   If you tried to quit in the past, what helped and what did not?   What will be the most difficult situations for you after you quit? How will you plan to handle them?   Who can help you through the tough times? Your family? Friends? Caregiver?   What pleasures do you get from smoking? What ways can you still get pleasure if you quit?  Here are some questions to ask your caregiver:  How can you help me to be successful at quitting?   What medicine do you think would be best for me and how should I take it?   What should I do if I need more help?   What is smoking withdrawal like? How can I get information on withdrawal?  Quitting takes hard work and a lot of effort,  but you can quit smoking. FOR MORE INFORMATION  Smokefree.gov (http://www.smokefree.gov) provides free, accurate, evidence-based information and professional assistance to help support the immediate and long-term needs of people trying to quit smoking. Document Released: 07/25/2001 Document Revised: 04/12/2011 Document Reviewed: 05/17/2009 [employer] Patient Information 2012 [employer]. 

## 2011-08-12 ENCOUNTER — Encounter: Payer: Self-pay | Admitting: Family Medicine

## 2011-08-12 DIAGNOSIS — F329 Major depressive disorder, single episode, unspecified: Secondary | ICD-10-CM | POA: Insufficient documentation

## 2011-08-12 DIAGNOSIS — F32A Depression, unspecified: Secondary | ICD-10-CM

## 2011-08-12 DIAGNOSIS — F419 Anxiety disorder, unspecified: Secondary | ICD-10-CM

## 2011-08-12 HISTORY — DX: Anxiety disorder, unspecified: F41.9

## 2011-08-12 HISTORY — DX: Depression, unspecified: F32.A

## 2011-08-12 NOTE — Progress Notes (Signed)
Scott Sosa [id number] [birth year] 08/12/2011      Progress Note-Follow Up  Subjective  Chief Complaint  Chief Complaint  Patient presents with  . wants to stop smoking    HPI  Patient is a 50 yo caucasian male in today to discuss the possibility of quitting smoking. He is making plans to quit smoking by New Years but feels he needs some help. He is struggling with SOB and fatigue. He continues to work second shift and feels that is contributing to his fatigue. No recent illness, fevers, chills, CP, palp, SOB, GI or GU c/o. He is struggling with increased stress, anxiety and some depressed mood. Has not had labwork run since May at the [place]. Does not check his sugars regularly but denies polyuria, polydipsia.  Past Medical History  Diagnosis Date  . Allergy     seasonal  . Diabetes mellitus 7-12  . Hypertension   . Chronic lower back pain   . Chicken pox as a teenager  . Arthritis   . Hyperlipidemia   . Low back pain 06/14/2011  . Neck pain, chronic 06/14/2011  . Overweight 06/14/2011  . HTN (hypertension) 06/14/2011  . Tobacco abuse 06/14/2011  . 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 Diabetes mellitus 06/14/2011  . Allergic state 06/14/2011  . Vitamin D deficiency   . Tinea corporis 06/20/2011  . Folliculitis 07/13/2011  . Fatigue 07/13/2011    History reviewed. No pertinent past surgical history.  Family History  Problem Relation Age of Onset  . Hyperlipidemia Mother   . Diabetes Mother     type 2  . Thyroid disease Mother   . Arthritis Mother     crippling  . Hyperlipidemia Father   . Cancer Father     gum/ smoked cigars  . Arthritis Sister     rheumatoid  . Other Sister     heart arrythmia  . Heart disease Sister   . Other Sister     bad back  . Arthritis Sister     chronic    History   Social History  . Marital Status: Married    Spouse Name: N/A    Number of Children: N/A  . Years of Education: N/A   Occupational History  . Not on file.   Social  History Main Topics  . Smoking status: Current Everyday Smoker -- 1.0 packs/day for 30 years    Types: Cigarettes  . Smokeless tobacco: Never Used  . Alcohol Use: Yes     12 pack of beer a week  . Drug Use: No  . Sexually Active: Yes -- Male partner(s)   Other Topics Concern  . Not on file   Social History Narrative  . No narrative on file    Current Outpatient Prescriptions on File Prior to Visit  Medication Sig Dispense Refill  . carisoprodol (SOMA) 350 MG tablet TAKE ONE TABLET BY MOUTH THREE TIMES DAILY AS NEEDED FOR MUSCLE SPASM  60 tablet  1  . Cholecalciferol 1000 UNITS tablet Take 1,000 Units by mouth daily.        Marland Kitchen lisinopril-hydrochlorothiazide (PRINZIDE,ZESTORETIC) 20-25 MG per tablet Take 1 tablet by mouth daily.        Marland Kitchen loratadine (CLARITIN) 10 MG tablet Take 10 mg by mouth daily.        . metFORMIN (GLUCOPHAGE) 500 MG tablet Take 500 mg by mouth 2 (two) times daily with a meal.        . metoprolol succinate (TOPROL XL) 25 MG  24 hr tablet Take 1 tablet (25 mg total) by mouth daily.  30 tablet  11  . niacin (SLO-NIACIN) 500 MG tablet Take 1,000 mg by mouth at bedtime.       Marland Kitchen amLODipine (NORVASC) 10 MG tablet Take 5 mg by mouth daily.         No Known Allergies  Review of Systems  Review of Systems  Constitutional: Positive for malaise/fatigue. Negative for fever.  HENT: Negative for congestion.   Eyes: Negative for discharge.  Respiratory: Negative for shortness of breath.   Cardiovascular: Negative for chest pain, palpitations and leg swelling.  Gastrointestinal: Negative for nausea, abdominal pain and diarrhea.  Genitourinary: Negative for dysuria.  Musculoskeletal: Negative for falls.  Skin: Negative for rash.  Neurological: Negative for loss of consciousness and headaches.  Endo/Heme/Allergies: Negative for polydipsia.  Psychiatric/Behavioral: Negative for depression and suicidal ideas. The patient is not nervous/anxious and does not have insomnia.       Objective  BP 132/90  Pulse 108  Temp(Src) 98.3 F (36.8 C) (Oral)  Ht 5' 7.75" (1.721 m)  Wt 219 lb 12.8 oz (99.701 kg)  BMI 33.67 kg/m2  SpO2 96%  Physical Exam  Physical Exam  Constitutional: He is oriented to person, place, and time and well-developed, well-nourished, and in no distress. No distress.  HENT:  Head: Normocephalic and atraumatic.  Eyes: Conjunctivae are normal.  Neck: Neck supple. No thyromegaly present.  Cardiovascular: Normal rate, regular rhythm and normal heart sounds.   No murmur heard. Pulmonary/Chest: Effort normal and breath sounds normal. No respiratory distress.  Abdominal: He exhibits no distension and no mass. There is no tenderness.  Musculoskeletal: He exhibits no edema.  Neurological: He is alert and oriented to person, place, and time.  Skin: Skin is warm.  Psychiatric: Memory, affect and judgment normal.        Assessment & Plan  Tobacco abuse Patient is ready to try and quit, his wife just started Chantix last week and he is ready to try quitting.   HTN (hypertension) Improved on repeat check, encouraged to minimize sodium and we will eevaluate at next visit.  COPD, mild Patient is agreeing to try and quit smoking, we will rx Nicotrol and he will return next month for further encouragement and evaluation. He is aware he needs to quit to arrest progression of disease  Fatigue We will check a testosterone level at his next visit as well as a TSH and CBC  Diabetes mellitus Will check a hgba1c with next visit. Minimize simple carbs  Anxiety and depression Will start Bupropion at 150 mg daily x 4 days and then increase to 300mg  daily

## 2011-08-12 NOTE — Assessment & Plan Note (Signed)
Improved on repeat check, encouraged to minimize sodium and we will eevaluate at next visit.

## 2011-08-12 NOTE — Assessment & Plan Note (Signed)
Will check a hgba1c with next visit. Minimize simple carbs

## 2011-08-12 NOTE — Assessment & Plan Note (Signed)
We will check a testosterone level at his next visit as well as a TSH and CBC

## 2011-08-12 NOTE — Assessment & Plan Note (Addendum)
Patient is agreeing to try and quit smoking, we will rx Nicotrol and he will return next month for further encouragement and evaluation. He is aware he needs to quit to arrest progression of disease

## 2011-08-12 NOTE — Assessment & Plan Note (Signed)
Will start Bupropion at 150 mg daily x 4 days and then increase to 300mg  daily

## 2011-09-08 ENCOUNTER — Other Ambulatory Visit: Payer: Self-pay | Admitting: Family Medicine

## 2011-09-08 DIAGNOSIS — G8929 Other chronic pain: Secondary | ICD-10-CM

## 2011-09-08 DIAGNOSIS — M545 Low back pain: Secondary | ICD-10-CM

## 2011-09-08 MED ORDER — OXYCODONE-ACETAMINOPHEN 10-325 MG PO TABS: 1.0000 | ORAL_TABLET | Freq: Four times a day (QID) | ORAL | Status: DC | PRN

## 2011-09-08 NOTE — Telephone Encounter (Signed)
Please advise refill? It looks like 08-11-11 was last refill?

## 2011-09-08 NOTE — Telephone Encounter (Signed)
Pt informed that RX is ready to be picked up and to be here befor 1:00 pm

## 2011-09-08 NOTE — Telephone Encounter (Signed)
He can have a refill with same sig and same number

## 2011-09-13 ENCOUNTER — Telehealth: Payer: Self-pay | Admitting: Family Medicine

## 2011-09-13 NOTE — Telephone Encounter (Signed)
Please add a blood test for rhematoid arthritis for his appt on Friday

## 2011-09-13 NOTE — Telephone Encounter (Signed)
Please add a rheumatoid factor when he comes in for labs for chronic back pain and arthritis

## 2011-09-14 NOTE — Telephone Encounter (Signed)
Thank you, I'm not sure if the orders are in yet?

## 2011-09-14 NOTE — Telephone Encounter (Signed)
Will have ordered

## 2011-09-15 ENCOUNTER — Other Ambulatory Visit: Payer: 59

## 2011-09-15 ENCOUNTER — Other Ambulatory Visit: Payer: Self-pay | Admitting: Family Medicine

## 2011-09-15 DIAGNOSIS — E119 Type 2 diabetes mellitus without complications: Secondary | ICD-10-CM

## 2011-09-15 DIAGNOSIS — M545 Low back pain: Secondary | ICD-10-CM

## 2011-09-15 DIAGNOSIS — I10 Essential (primary) hypertension: Secondary | ICD-10-CM

## 2011-09-15 DIAGNOSIS — M1990 Unspecified osteoarthritis, unspecified site: Secondary | ICD-10-CM

## 2011-09-15 DIAGNOSIS — R5381 Other malaise: Secondary | ICD-10-CM

## 2011-09-15 DIAGNOSIS — M549 Dorsalgia, unspecified: Secondary | ICD-10-CM

## 2011-09-16 LAB — BASIC METABOLIC PANEL
Calcium: 9.7 mg/dL (ref 8.4–10.5)
Creat: 0.72 mg/dL (ref 0.50–1.35)

## 2011-09-16 LAB — HEPATIC FUNCTION PANEL
AST: 20 U/L (ref 0–37)
Albumin: 4.9 g/dL (ref 3.5–5.2)
Alkaline Phosphatase: 49 U/L (ref 39–117)
Total Protein: 7.3 g/dL (ref 6.0–8.3)

## 2011-09-16 LAB — CBC
MCH: 31 pg (ref 26.0–34.0)
MCHC: 33.9 g/dL (ref 30.0–36.0)
MCV: 91.5 fL (ref 78.0–100.0)
Platelets: 312 10*3/uL (ref 150–400)
RDW: 14.1 % (ref 11.5–15.5)

## 2011-09-16 LAB — LIPID PANEL
HDL: 49 mg/dL (ref >39)
Triglycerides: 187 mg/dL — ABNORMAL HIGH (ref <150)

## 2011-09-16 LAB — HEMOGLOBIN A1C: Mean Plasma Glucose: 128 mg/dL — ABNORMAL HIGH (ref <117)

## 2011-09-19 ENCOUNTER — Other Ambulatory Visit: Payer: Self-pay | Admitting: Family Medicine

## 2011-09-20 LAB — VITAMIN D 1,25 DIHYDROXY
Vitamin D 1, 25 (OH)2 Total: 35 pg/mL (ref 18–72)
Vitamin D3 1, 25 (OH)2: 35 pg/mL

## 2011-09-22 ENCOUNTER — Ambulatory Visit: Payer: 59 | Admitting: Family Medicine

## 2011-09-25 ENCOUNTER — Ambulatory Visit (INDEPENDENT_AMBULATORY_CARE_PROVIDER_SITE_OTHER): Payer: 59 | Admitting: Family Medicine

## 2011-09-25 ENCOUNTER — Encounter: Payer: Self-pay | Admitting: Family Medicine

## 2011-09-25 DIAGNOSIS — I10 Essential (primary) hypertension: Secondary | ICD-10-CM

## 2011-09-25 DIAGNOSIS — E785 Hyperlipidemia, unspecified: Secondary | ICD-10-CM

## 2011-09-25 DIAGNOSIS — Z720 Tobacco use: Secondary | ICD-10-CM

## 2011-09-25 DIAGNOSIS — M545 Low back pain: Secondary | ICD-10-CM

## 2011-09-25 DIAGNOSIS — E119 Type 2 diabetes mellitus without complications: Secondary | ICD-10-CM

## 2011-09-25 DIAGNOSIS — M542 Cervicalgia: Secondary | ICD-10-CM

## 2011-09-25 DIAGNOSIS — F17200 Nicotine dependence, unspecified, uncomplicated: Secondary | ICD-10-CM

## 2011-09-25 DIAGNOSIS — G4730 Sleep apnea, unspecified: Secondary | ICD-10-CM

## 2011-09-25 DIAGNOSIS — F341 Dysthymic disorder: Secondary | ICD-10-CM

## 2011-09-25 DIAGNOSIS — E349 Endocrine disorder, unspecified: Secondary | ICD-10-CM

## 2011-09-25 DIAGNOSIS — F419 Anxiety disorder, unspecified: Secondary | ICD-10-CM

## 2011-09-25 DIAGNOSIS — J449 Chronic obstructive pulmonary disease, unspecified: Secondary | ICD-10-CM

## 2011-09-25 DIAGNOSIS — G8929 Other chronic pain: Secondary | ICD-10-CM

## 2011-09-25 DIAGNOSIS — E291 Testicular hypofunction: Secondary | ICD-10-CM

## 2011-09-25 DIAGNOSIS — E663 Overweight: Secondary | ICD-10-CM

## 2011-09-25 HISTORY — DX: Endocrine disorder, unspecified: E34.9

## 2011-09-25 HISTORY — DX: Sleep apnea, unspecified: G47.30

## 2011-09-25 MED ORDER — OXYCODONE-ACETAMINOPHEN 10-325 MG PO TABS: 1.0000 | ORAL_TABLET | Freq: Three times a day (TID) | ORAL | Status: DC | PRN

## 2011-09-25 MED ORDER — TESTOSTERONE CYPIONATE 100 MG/ML IM SOLN: 100.0000 mg | INTRAMUSCULAR | Status: DC

## 2011-09-25 MED ORDER — LOVASTATIN 10 MG PO TABS: 10.0000 mg | ORAL_TABLET | Freq: Every day | ORAL | Status: DC

## 2011-09-25 NOTE — Assessment & Plan Note (Signed)
Test with Aeroflow which showed severe sleep apnea. He is trying to work out how to pay for it. His insurance has a $2500 and then they will only pay 70%. He is presently discussing his options with the [place]

## 2011-09-25 NOTE — Progress Notes (Signed)
Patient ID: Scott Sosa, male   DOB: April 18, 1961, 51 y.o.   MRN: [id number] Scott Sosa [id number] 07-20-1961 09/25/2011      Progress Note-Follow Up  Subjective  Chief Complaint  Chief Complaint  Patient presents with  . Follow-up    2 month follow up    HPI  Is a 51 year old Caucasian male who is in today for evaluation of multiple concerns. His sleep did improve severe sleep apnea and is in the process of having her husband to pay for his machine. Try to work at the [place] to get him to cover the study. Continues to struggle with fatigue and restless sleep. No recent acute illness, fevers, headache, chest pain no palpitation, shortness of breath. Continues to struggle back pain and roughly 3 doses of Percocet daily seemed to make his pain tolerable. Denies polyuria and polydipsia  Past Medical History  Diagnosis Date  . Allergy     seasonal  . Diabetes mellitus 7-12  . Hypertension   . Chronic lower back pain   . Chicken pox as a teenager  . Arthritis   . Hyperlipidemia   . Low back pain 06/14/2011  . Neck pain, chronic 06/14/2011  . Overweight 06/14/2011  . HTN (hypertension) 06/14/2011  . Tobacco abuse 06/14/2011  . 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 Diabetes mellitus 06/14/2011  . Allergic state 06/14/2011  . Vitamin d deficiency   . Tinea corporis 06/20/2011  . Folliculitis 07/13/2011  . Fatigue 07/13/2011  . Anxiety and depression 08/12/2011  . Sleep apnea 09/25/2011  . Testosterone deficiency 09/25/2011    History reviewed. No pertinent past surgical history.  Family History  Problem Relation Age of Onset  . Hyperlipidemia Mother   . Diabetes Mother     type 2  . Thyroid disease Mother   . Arthritis Mother     crippling  . Hyperlipidemia Father   . Cancer Father     gum/ smoked cigars  . Arthritis Sister     rheumatoid  . Other Sister     heart arrythmia  . Heart disease Sister   . Other Sister     bad back  . Arthritis Sister     chronic    History    Social History  . Marital Status: Married    Spouse Name: N/A    Number of Children: N/A  . Years of Education: N/A   Occupational History  . Not on file.   Social History Main Topics  . Smoking status: Current Everyday Smoker -- 0.5 packs/day for 30 years    Types: Cigarettes  . Smokeless tobacco: Never Used  . Alcohol Use: Yes     12 pack of beer a week  . Drug Use: No  . Sexually Active: Yes -- Male partner(s)   Other Topics Concern  . Not on file   Social History Narrative  . No narrative on file    Current Outpatient Prescriptions on File Prior to Visit  Medication Sig Dispense Refill  . amLODipine (NORVASC) 10 MG tablet Take 5 mg by mouth daily.       . carisoprodol (SOMA) 350 MG tablet TAKE ONE TABLET BY MOUTH THREE TIMES DAILY AS NEEDED FOR MUSCLE SPASM  60 tablet  0  . Cholecalciferol 1000 UNITS tablet Take 1,000 Units by mouth daily.        Marland Kitchen lisinopril-hydrochlorothiazide (PRINZIDE,ZESTORETIC) 20-25 MG per tablet Take 1 tablet by mouth daily.        Marland Kitchen loratadine (  CLARITIN) 10 MG tablet Take 10 mg by mouth daily.        . metFORMIN (GLUCOPHAGE) 500 MG tablet Take 500 mg by mouth 2 (two) times daily with a meal.        . metoprolol succinate (TOPROL XL) 25 MG 24 hr tablet Take 1 tablet (25 mg total) by mouth daily.  30 tablet  11  . niacin (SLO-NIACIN) 500 MG tablet Take 1,000 mg by mouth at bedtime.         No Known Allergies  Review of Systems  Review of Systems  Constitutional: Positive for malaise/fatigue. Negative for fever.  HENT: Negative for congestion.   Eyes: Negative for discharge.  Respiratory: Negative for shortness of breath.   Cardiovascular: Negative for chest pain, palpitations and leg swelling.  Gastrointestinal: Negative for nausea, abdominal pain and diarrhea.  Genitourinary: Negative for dysuria.  Musculoskeletal: Positive for back pain. Negative for falls.  Skin: Negative for rash.  Neurological: Negative for loss of  consciousness and headaches.  Endo/Heme/Allergies: Negative for polydipsia.  Psychiatric/Behavioral: Negative for depression and suicidal ideas. The patient is not nervous/anxious and does not have insomnia.     Objective  BP 127/88  Pulse 96  Temp(Src) 97.6 F (36.4 C) (Temporal)  Ht 5' 7.75" (1.721 m)  Wt 219 lb 1.9 oz (99.392 kg)  BMI 33.56 kg/m2  SpO2 96%  Physical Exam  Physical Exam  Constitutional: He is oriented to person, place, and time and well-developed, well-nourished, and in no distress. No distress.  HENT:  Head: Normocephalic and atraumatic.  Eyes: Conjunctivae are normal.  Neck: Neck supple. No thyromegaly present.  Cardiovascular: Normal rate, regular rhythm and normal heart sounds.   No murmur heard. Pulmonary/Chest: Effort normal and breath sounds normal. No respiratory distress.  Abdominal: He exhibits no distension and no mass. There is no tenderness.  Musculoskeletal: He exhibits no edema.  Neurological: He is alert and oriented to person, place, and time.  Skin: Skin is warm.  Psychiatric: Memory, affect and judgment normal.    Lab Results  Component Value Date   TSH 1.313 09/15/2011   Lab Results  Component Value Date   WBC 10.4 09/15/2011   HGB 14.9 09/15/2011   HCT 44.0 09/15/2011   MCV 91.5 09/15/2011   PLT 312 09/15/2011   Lab Results  Component Value Date   CREATININE 0.72 09/15/2011   BUN 14 09/15/2011   NA 137 09/15/2011   K 4.3 09/15/2011   CL 100 09/15/2011   CO2 26 09/15/2011   Lab Results  Component Value Date   ALT 26 09/15/2011   AST 20 09/15/2011   ALKPHOS 49 09/15/2011   BILITOT 0.3 09/15/2011   Lab Results  Component Value Date   CHOL 262* 09/15/2011   Lab Results  Component Value Date   HDL 49 09/15/2011   Lab Results  Component Value Date   LDLCALC 176* 09/15/2011   Lab Results  Component Value Date   TRIG 187* 09/15/2011   Lab Results  Component Value Date   CHOLHDL 5.3 09/15/2011     Assessment & Plan  Tobacco abuse Has cut his  intake by about 1/2, is trying to cut back consciously. Wellbutrin caused irritability and did not help the cravings. The Nicotrol inhaler was too expensive. Encouraged to continue to try cessation and to avoid as many cigarettes as possible  HTN (hypertension) Well controlled today, no changes  Diabetes mellitus hgba1c 6.1, tolerating Metformin, minimize simple carbs  Sleep apnea  Test with Aeroflow which showed severe sleep apnea. He is trying to work out how to pay for it. His insurance has a $2500 and then they will only pay 70%. [facility] is [facility]  Testosterone deficiency Will start with Testosterone shots every other week.   Hyperlipidemia Lovastatin 10 mg po qhs, #30, disp: 30  Overweight Encouraged DASH diet and increased activity  Low back pain Finds pain manageable with tid dosing of Oxycodone, he is notified if he needs to go to pain management for further help. He is advised not to take the medications and drive.

## 2011-09-25 NOTE — Assessment & Plan Note (Signed)
Encouraged complete smoking cessation to arrest progression

## 2011-09-25 NOTE — Assessment & Plan Note (Signed)
Will start with Testosterone shots every other week.

## 2011-09-25 NOTE — Assessment & Plan Note (Signed)
Finds pain manageable with tid dosing of Oxycodone, he is notified if he needs to go to pain management for further help. He is advised not to take the medications and drive.

## 2011-09-25 NOTE — Assessment & Plan Note (Signed)
hgba1c 6.1, tolerating Metformin, minimize simple carbs

## 2011-09-25 NOTE — Assessment & Plan Note (Signed)
Became very irritable on Wellbutrin, would like to abstain from starting any other meds for depression til he sees how he responds to treatment of his testosterone and sleep apnea

## 2011-09-25 NOTE — Patient Instructions (Signed)
Testosterone injection What is this medicine? TESTOSTERONE (tes TOS ter one) is the main male hormone. It supports normal male development such as muscle growth, facial hair, and deep voice. It is used in males to treat low testosterone levels. This medicine may be used for other purposes; ask your health care provider or pharmacist if you have questions. What should I tell my health care provider before I take this medicine? They need to know if you have any of these conditions: -breast cancer -diabetes -heart disease -kidney disease -liver disease -lung disease -prostate cancer, enlargement -an unusual or allergic reaction to testosterone, other medicines, foods, dyes, or preservatives -pregnant or trying to get pregnant -breast-feeding How should I use this medicine? This medicine is for injection into a muscle. It is usually given by a health care professional in a hospital or clinic setting. Contact your pediatrician regarding the use of this medicine in children. While this medicine may be prescribed for children as young as 12 years of age for selected conditions, precautions do apply. Overdosage: If you think you have taken too much of this medicine contact a poison control center or emergency room at once. NOTE: This medicine is only for you. Do not share this medicine with others. What if I miss a dose? Try not to miss a dose. Your doctor or health care professional will tell you when your next injection is due. Notify the office if you are unable to keep an appointment. What may interact with this medicine? -medicines for diabetes -medicines that treat or prevent blood clots like warfarin -oxyphenbutazone -propranolol -steroid medicines like prednisone or cortisone This list may not describe all possible interactions. Give your health care provider a list of all the medicines, herbs, non-prescription drugs, or dietary supplements you use. Also tell them if you smoke, drink  alcohol, or use illegal drugs. Some items may interact with your medicine. What should I watch for while using this medicine? Visit your doctor or health care professional for regular checks on your progress. They will need to check the level of testosterone in your blood. This medicine may affect blood sugar levels. If you have diabetes, check with your doctor or health care professional before you change your diet or the dose of your diabetic medicine. This drug is banned from use in athletes by most athletic organizations. What side effects may I notice from receiving this medicine? Side effects that you should report to your doctor or health care professional as soon as possible: -allergic reactions like skin rash, itching or hives, swelling of the face, lips, or tongue -breast enlargement -breathing problems -changes in mood, especially anger, depression, or rage -dark urine -general ill feeling or flu-like symptoms -light-colored stools -loss of appetite, nausea -nausea, vomiting -right upper belly pain -stomach pain -swelling of ankles -too frequent or persistent erections -trouble passing urine or change in the amount of urine -unusually weak or tired -yellowing of the eyes or skin Additional side effects that can occur in women include: -deep or hoarse voice -facial hair growth -irregular menstrual periods Side effects that usually do not require medical attention (report to your doctor or health care professional if they continue or are bothersome): -acne -change in sex drive or performance -hair loss -headache This list may not describe all possible side effects. Call your doctor for medical advice about side effects. You may report side effects to FDA at 1-800-FDA-1088. Where should I keep my medicine? Keep out of the reach of children. This medicine   can be abused. Keep your medicine in a safe place to protect it from theft. Do not share this medicine with anyone.  Selling or giving away this medicine is dangerous and against the law. Store at room temperature between 20 and 25 degrees C (68 and 77 degrees F). Do not freeze. Protect from light. Follow the directions for the product you are prescribed. Throw away any unused medicine after the expiration date. NOTE: This sheet is a summary. It may not cover all possible information. If you have questions about this medicine, talk to your doctor, pharmacist, or health care provider.  2012, [employer] Standard. (10/11/2007 4:13:46 PM) 

## 2011-09-25 NOTE — Assessment & Plan Note (Signed)
Lovastatin 10 mg po qhs, #30, disp: 30

## 2011-09-25 NOTE — Assessment & Plan Note (Signed)
Encouraged DASH diet and increased activity. 

## 2011-09-25 NOTE — Assessment & Plan Note (Signed)
Has cut his intake by about 1/2, is trying to cut back consciously. Wellbutrin caused irritability and did not help the cravings. The Nicotrol inhaler was too expensive. Encouraged to continue to try cessation and to avoid as many cigarettes as possible

## 2011-09-25 NOTE — Assessment & Plan Note (Signed)
Well controlled today, no changes 

## 2011-09-28 ENCOUNTER — Ambulatory Visit: Payer: 59

## 2011-10-05 ENCOUNTER — Telehealth: Payer: Self-pay

## 2011-10-05 MED ORDER — TESTOSTERONE CYPIONATE 200 MG/ML IM SOLN: 100.0000 mg | INTRAMUSCULAR | Status: DC

## 2011-10-05 NOTE — Telephone Encounter (Signed)
He can have a rx that dispenses the Testosterone 200mg /1 cc dose, it just has to read to inject 1/2 ml at same time interval instead of 1 ml.

## 2011-10-05 NOTE — Telephone Encounter (Signed)
Updated RX and left a detailed message on patients cell phone. RX signed by Dr Milinda Cave and RX put in front cabinet.

## 2011-10-05 NOTE — Telephone Encounter (Signed)
Pt left a message stating that all the pharmacies are out of 100 mg testosterone. Pt stated that pharmacist told him that MD needs to redo the RX stating that it can be 200mg  testosterone? Please advise?

## 2011-10-09 ENCOUNTER — Ambulatory Visit: Payer: 59

## 2011-10-09 ENCOUNTER — Ambulatory Visit (INDEPENDENT_AMBULATORY_CARE_PROVIDER_SITE_OTHER): Payer: 59

## 2011-10-09 DIAGNOSIS — E291 Testicular hypofunction: Secondary | ICD-10-CM

## 2011-10-09 DIAGNOSIS — E349 Endocrine disorder, unspecified: Secondary | ICD-10-CM

## 2011-10-09 MED ORDER — TESTOSTERONE CYPIONATE 200 MG/ML IM SOLN
100.0000 mg | INTRAMUSCULAR | Status: DC
  Administered 2011-10-09: 100 mg via INTRAMUSCULAR

## 2011-10-11 ENCOUNTER — Ambulatory Visit: Payer: 59 | Admitting: Family Medicine

## 2011-10-20 ENCOUNTER — Encounter: Payer: Self-pay | Admitting: Family Medicine

## 2011-10-23 ENCOUNTER — Ambulatory Visit (INDEPENDENT_AMBULATORY_CARE_PROVIDER_SITE_OTHER): Payer: 59

## 2011-10-23 ENCOUNTER — Other Ambulatory Visit: Payer: Self-pay

## 2011-10-23 DIAGNOSIS — E349 Endocrine disorder, unspecified: Secondary | ICD-10-CM

## 2011-10-23 DIAGNOSIS — M542 Cervicalgia: Secondary | ICD-10-CM

## 2011-10-23 DIAGNOSIS — E291 Testicular hypofunction: Secondary | ICD-10-CM

## 2011-10-23 DIAGNOSIS — M545 Low back pain: Secondary | ICD-10-CM

## 2011-10-23 MED ORDER — TESTOSTERONE CYPIONATE 200 MG/ML IM SOLN: 100.0000 mg | INTRAMUSCULAR | Status: DC

## 2011-10-23 MED ORDER — OXYCODONE-ACETAMINOPHEN 10-325 MG PO TABS: 1.0000 | ORAL_TABLET | Freq: Three times a day (TID) | ORAL | Status: DC | PRN

## 2011-10-23 MED ORDER — TESTOSTERONE CYPIONATE 200 MG/ML IM SOLN
100.0000 mg | INTRAMUSCULAR | Status: DC
Start: 2011-10-23 — End: 2011-10-23
  Administered 2011-10-23: 100 mg via INTRAMUSCULAR

## 2011-10-23 NOTE — Telephone Encounter (Signed)
RX's gave to patient

## 2011-10-23 NOTE — Telephone Encounter (Signed)
Please advise refills 

## 2011-11-06 ENCOUNTER — Ambulatory Visit: Payer: 59 | Admitting: Family Medicine

## 2011-11-08 ENCOUNTER — Ambulatory Visit: Payer: 59 | Admitting: Family Medicine

## 2011-11-13 ENCOUNTER — Encounter: Payer: Self-pay | Admitting: Family Medicine

## 2011-11-13 ENCOUNTER — Ambulatory Visit (INDEPENDENT_AMBULATORY_CARE_PROVIDER_SITE_OTHER): Payer: 59 | Admitting: Family Medicine

## 2011-11-13 VITALS — BP 138/86 | HR 91 | Temp 98.0°F | Ht 67.75 in | Wt 212.0 lb

## 2011-11-13 DIAGNOSIS — F32A Depression, unspecified: Secondary | ICD-10-CM

## 2011-11-13 DIAGNOSIS — E291 Testicular hypofunction: Secondary | ICD-10-CM

## 2011-11-13 DIAGNOSIS — E119 Type 2 diabetes mellitus without complications: Secondary | ICD-10-CM

## 2011-11-13 DIAGNOSIS — F419 Anxiety disorder, unspecified: Secondary | ICD-10-CM

## 2011-11-13 DIAGNOSIS — R5383 Other fatigue: Secondary | ICD-10-CM

## 2011-11-13 DIAGNOSIS — E785 Hyperlipidemia, unspecified: Secondary | ICD-10-CM

## 2011-11-13 DIAGNOSIS — E349 Endocrine disorder, unspecified: Secondary | ICD-10-CM

## 2011-11-13 DIAGNOSIS — F341 Dysthymic disorder: Secondary | ICD-10-CM

## 2011-11-13 DIAGNOSIS — I10 Essential (primary) hypertension: Secondary | ICD-10-CM

## 2011-11-13 DIAGNOSIS — Z720 Tobacco use: Secondary | ICD-10-CM

## 2011-11-13 DIAGNOSIS — F17200 Nicotine dependence, unspecified, uncomplicated: Secondary | ICD-10-CM

## 2011-11-13 DIAGNOSIS — G4730 Sleep apnea, unspecified: Secondary | ICD-10-CM

## 2011-11-13 DIAGNOSIS — E663 Overweight: Secondary | ICD-10-CM

## 2011-11-13 MED ORDER — TESTOSTERONE CYPIONATE 200 MG/ML IM SOLN
100.0000 mg | INTRAMUSCULAR | Status: DC
  Administered 2011-11-13: 100 mg via INTRAMUSCULAR

## 2011-11-13 NOTE — Assessment & Plan Note (Signed)
Doing well, no change in meds.  

## 2011-11-13 NOTE — Patient Instructions (Signed)

## 2011-11-13 NOTE — Assessment & Plan Note (Addendum)
Did not tolerate Bupropion, encouraged to continue cessation attempts, counseled on techniques

## 2011-11-21 ENCOUNTER — Ambulatory Visit (INDEPENDENT_AMBULATORY_CARE_PROVIDER_SITE_OTHER): Payer: 59 | Admitting: Family Medicine

## 2011-11-21 ENCOUNTER — Encounter: Payer: Self-pay | Admitting: Family Medicine

## 2011-11-21 VITALS — BP 132/75 | HR 90 | Temp 97.2°F | Ht 67.75 in | Wt 211.8 lb

## 2011-11-21 DIAGNOSIS — M79673 Pain in unspecified foot: Secondary | ICD-10-CM

## 2011-11-21 DIAGNOSIS — G8929 Other chronic pain: Secondary | ICD-10-CM

## 2011-11-21 DIAGNOSIS — M79672 Pain in left foot: Secondary | ICD-10-CM | POA: Insufficient documentation

## 2011-11-21 DIAGNOSIS — M545 Low back pain: Secondary | ICD-10-CM

## 2011-11-21 DIAGNOSIS — I10 Essential (primary) hypertension: Secondary | ICD-10-CM

## 2011-11-21 DIAGNOSIS — M542 Cervicalgia: Secondary | ICD-10-CM

## 2011-11-21 HISTORY — DX: Pain in left foot: M79.672

## 2011-11-21 MED ORDER — OXYCODONE-ACETAMINOPHEN 10-325 MG PO TABS: 1.0000 | ORAL_TABLET | Freq: Three times a day (TID) | ORAL | Status: DC | PRN

## 2011-11-21 MED ORDER — IBUPROFEN 400 MG PO TABS: 400.0000 mg | ORAL_TABLET | Freq: Three times a day (TID) | ORAL | Status: AC | PRN

## 2011-11-21 NOTE — Assessment & Plan Note (Signed)
Feels he is doing OK for now and declines further meds for this presently

## 2011-11-21 NOTE — Assessment & Plan Note (Signed)
Tolerating Mevacor 

## 2011-11-21 NOTE — Progress Notes (Signed)
Patient ID: Scott Sosa, male   DOB: Jul 14, 1961, 51 y.o.   MRN: [id number] Scott Sosa [id number] 1961/02/06 11/21/2011      Progress Note-Follow Up  Subjective  Chief Complaint  Chief Complaint  Patient presents with  . Follow-up    on testosterone  . Injections    testosterone    HPI  Patient is a 51 year old Caucasian male who is in today for followup of multiple medical problems. He is here today for repeat testosterone injection and does feel as if his energy level and libido are slowly improving. He tried appropriate for some depression and it made him here mostly stopped. Unfortunately he continues to smoke. Half pack a day. Reports his blood sugars are ranging from 80-140 in the a.m. Denies polyuria or polydipsia. Continues to struggle with chronic back pain but his pain medications make it tolerable. No recent illness, fevers, chills, chest pain, palpitations, shortness of breath, GI or GU complaints and he reports he is otherwise taking his medications as prescribed.  Past Medical History  Diagnosis Date  . Allergy     seasonal  . Diabetes mellitus 7-12  . Hypertension   . Chronic lower back pain   . Chicken pox as a teenager  . Arthritis   . Hyperlipidemia   . Low back pain 06/14/2011  . Neck pain, chronic 06/14/2011  . Overweight 06/14/2011  . HTN (hypertension) 06/14/2011  . Tobacco abuse 06/14/2011  . 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 Diabetes mellitus 06/14/2011  . Allergic state 06/14/2011  . Vitamin d deficiency   . Tinea corporis 06/20/2011  . Folliculitis 07/13/2011  . Fatigue 07/13/2011  . Anxiety and depression 08/12/2011  . Sleep apnea 09/25/2011  . Testosterone deficiency 09/25/2011    History reviewed. No pertinent past surgical history.  Family History  Problem Relation Age of Onset  . Hyperlipidemia Mother   . Diabetes Mother     type 2  . Thyroid disease Mother   . Arthritis Mother     crippling  . Hyperlipidemia Father   . Cancer Father      gum/ smoked cigars  . Arthritis Sister     rheumatoid  . Other Sister     heart arrythmia  . Heart disease Sister   . Other Sister     bad back  . Arthritis Sister     chronic    History   Social History  . Marital Status: Married    Spouse Name: N/A    Number of Children: N/A  . Years of Education: N/A   Occupational History  . Not on file.   Social History Main Topics  . Smoking status: Current Everyday Smoker -- 0.5 packs/day for 30 years    Types: Cigarettes  . Smokeless tobacco: Never Used  . Alcohol Use: Yes     12 pack of beer a week  . Drug Use: No  . Sexually Active: Yes -- Male partner(s)   Other Topics Concern  . Not on file   Social History Narrative  . No narrative on file    Current Outpatient Prescriptions on File Prior to Visit  Medication Sig Dispense Refill  . amLODipine (NORVASC) 10 MG tablet Take 5 mg by mouth daily.       . carisoprodol (SOMA) 350 MG tablet TAKE ONE TABLET BY MOUTH THREE TIMES DAILY AS NEEDED FOR MUSCLE SPASM  60 tablet  0  . Cholecalciferol 1000 UNITS tablet Take 1,000 Units by mouth daily.        Marland Kitchen  lisinopril-hydrochlorothiazide (PRINZIDE,ZESTORETIC) 20-25 MG per tablet Take 1 tablet by mouth daily.        Marland Kitchen loratadine (CLARITIN) 10 MG tablet Take 10 mg by mouth daily.        Marland Kitchen lovastatin (MEVACOR) 10 MG tablet Take 1 tablet (10 mg total) by mouth at bedtime.  30 tablet  3  . metFORMIN (GLUCOPHAGE) 500 MG tablet Take 500 mg by mouth 2 (two) times daily with a meal.        . metoprolol succinate (TOPROL XL) 25 MG 24 hr tablet Take 1 tablet (25 mg total) by mouth daily.  30 tablet  11  . niacin (SLO-NIACIN) 500 MG tablet Take 1,000 mg by mouth at bedtime.       Marland Kitchen oxyCODONE-acetaminophen (PERCOCET) 10-325 MG per tablet Take 1 tablet by mouth every 8 (eight) hours as needed for pain.  90 tablet  0  . testosterone cypionate (DEPOTESTOTERONE CYPIONATE) 200 MG/ML injection Inject 0.5 mLs (100 mg total) into the muscle every 14  (fourteen) days.  10 mL  0   No current facility-administered medications on file prior to visit.    Allergies  Allergen Reactions  . Wellbutrin (Bupropion Hcl)     irritable    Review of Systems  Review of Systems  Constitutional: Positive for malaise/fatigue. Negative for fever.  HENT: Negative for congestion.   Eyes: Negative for discharge.  Respiratory: Negative for shortness of breath.   Cardiovascular: Negative for chest pain, palpitations and leg swelling.  Gastrointestinal: Negative for nausea, abdominal pain and diarrhea.  Genitourinary: Negative for dysuria.  Musculoskeletal: Positive for back pain. Negative for falls.  Skin: Negative for rash.  Neurological: Negative for loss of consciousness and headaches.  Endo/Heme/Allergies: Negative for polydipsia.  Psychiatric/Behavioral: Negative for depression and suicidal ideas. The patient is not nervous/anxious and does not have insomnia.     Objective  BP 138/86  Pulse 91  Temp(Src) 98 F (36.7 C) (Temporal)  Ht 5' 7.75" (1.721 m)  Wt 212 lb (96.163 kg)  BMI 32.47 kg/m2  SpO2 96%  Physical Exam  Physical Exam  Constitutional: He is oriented to person, place, and time and well-developed, well-nourished, and in no distress. No distress.  HENT:  Head: Normocephalic and atraumatic.  Eyes: Conjunctivae are normal.  Neck: Neck supple. No thyromegaly present.  Cardiovascular: Normal rate, regular rhythm and normal heart sounds.   No murmur heard. Pulmonary/Chest: Effort normal and breath sounds normal. No respiratory distress.  Abdominal: He exhibits no distension and no mass. There is no tenderness.  Musculoskeletal: He exhibits no edema.  Neurological: He is alert and oriented to person, place, and time.  Skin: Skin is warm.  Psychiatric: Memory, affect and judgment normal.    Lab Results  Component Value Date   TSH 1.313 09/15/2011   Lab Results  Component Value Date   WBC 10.4 09/15/2011   HGB 14.9  09/15/2011   HCT 44.0 09/15/2011   MCV 91.5 09/15/2011   PLT 312 09/15/2011   Lab Results  Component Value Date   CREATININE 0.72 09/15/2011   BUN 14 09/15/2011   NA 137 09/15/2011   K 4.3 09/15/2011   CL 100 09/15/2011   CO2 26 09/15/2011   Lab Results  Component Value Date   ALT 26 09/15/2011   AST 20 09/15/2011   ALKPHOS 49 09/15/2011   BILITOT 0.3 09/15/2011   Lab Results  Component Value Date   CHOL 262* 09/15/2011   Lab Results  Component Value  Date   HDL 49 09/15/2011   Lab Results  Component Value Date   LDLCALC 176* 09/15/2011   Lab Results  Component Value Date   TRIG 187* 09/15/2011   Lab Results  Component Value Date   CHOLHDL 5.3 09/15/2011     Assessment & Plan  Overweight Doing well, no change in meds.   Tobacco abuse Did not tolerate Bupropion, encouraged to continue cessation attempts, counseled on techniques  HTN (hypertension) Adequately controlled on current meds  Testosterone deficiency Tolerating injections and he does believe he is starting to feel better and less fatigued  Sleep apnea Using CPAP  Fatigue Improving   Anxiety and depression Feels he is doing OK for now and declines further meds for this presently  Diabetes mellitus BS ranging from 88 to 140 in am, avoid simple carbs and maintain current meds  Hyperlipidemia Tolerating Mevacor

## 2011-11-21 NOTE — Progress Notes (Signed)
Patient ID: Scott Sosa, male   DOB: Oct 11, 1960, 51 y.o.   MRN: [id number] Scott Sosa [id number] 1961-03-11 11/21/2011      Progress Note-Follow Up  Subjective  Chief Complaint  Chief Complaint  Patient presents with  . top of foot hurts    left foot and left ankle X 2 days    HPI  Patient is a 51 year old Caucasian male who presented with two-day history of worsening foot and ankle pain. He reports a long history of intermittent pain in his left foot and ankle. 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 He often has pain in his foot after a long day of work on concrete floors but finds it tolerable with a silk and resting improves. Over the last 2 days the pain has not improved until last he took 800 mg about 12 and today it is still painful but somewhat improved. He denies redness or warmth. Denies any active trauma. It has been tender even to light touch. No numbness tingling weakness. No other acute complaints such as chest pain palpitations recent fever or concerns are noted.  Past Medical History  Diagnosis Date  . Allergy     seasonal  . Diabetes mellitus 7-12  . Hypertension   . Chronic lower back pain   . Chicken pox as a teenager  . Arthritis   . Hyperlipidemia   . Low back pain 06/14/2011  . Neck pain, chronic 06/14/2011  . Overweight 06/14/2011  . HTN (hypertension) 06/14/2011  . Tobacco abuse 06/14/2011  . 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 Diabetes mellitus 06/14/2011  . Allergic state 06/14/2011  . Vitamin d deficiency   . Tinea corporis 06/20/2011  . Folliculitis 07/13/2011  . Fatigue 07/13/2011  . Anxiety and depression 08/12/2011  . Sleep apnea 09/25/2011  . Testosterone deficiency 09/25/2011  . Foot pain, left 11/21/2011    No past surgical history on file.  Family History  Problem Relation Age of Onset  . Hyperlipidemia Mother   . Diabetes Mother     type 2  . Thyroid disease Mother   . Arthritis  Mother     crippling  . Hyperlipidemia Father   . Cancer Father     gum/ smoked cigars  . Arthritis Sister     rheumatoid  . Other Sister     heart arrythmia  . Heart disease Sister   . Other Sister     bad back  . Arthritis Sister     chronic    History   Social History  . Marital Status: Married    Spouse Name: N/A    Number of Children: N/A  . Years of Education: N/A   Occupational History  . Not on file.   Social History Main Topics  . Smoking status: Current Everyday Smoker -- 0.5 packs/day for 30 years    Types: Cigarettes  . Smokeless tobacco: Never Used  . Alcohol Use: Yes     12 pack of beer a week  . Drug Use: No  . Sexually Active: Yes -- Male partner(s)   Other Topics Concern  . Not on file   Social History Narrative  . No narrative on file    Current Outpatient Prescriptions on File Prior to Visit  Medication Sig Dispense Refill  . amLODipine (NORVASC) 10 MG tablet Take 5 mg by mouth daily.       . carisoprodol (SOMA) 350 MG tablet TAKE  ONE TABLET BY MOUTH THREE TIMES DAILY AS NEEDED FOR MUSCLE SPASM  60 tablet  0  . Cholecalciferol 1000 UNITS tablet Take 1,000 Units by mouth daily.        Marland Kitchen lisinopril-hydrochlorothiazide (PRINZIDE,ZESTORETIC) 20-25 MG per tablet Take 1 tablet by mouth daily.        Marland Kitchen loratadine (CLARITIN) 10 MG tablet Take 10 mg by mouth daily.        Marland Kitchen lovastatin (MEVACOR) 10 MG tablet Take 1 tablet (10 mg total) by mouth at bedtime.  30 tablet  3  . metFORMIN (GLUCOPHAGE) 500 MG tablet Take 500 mg by mouth 2 (two) times daily with a meal.        . metoprolol succinate (TOPROL XL) 25 MG 24 hr tablet Take 1 tablet (25 mg total) by mouth daily.  30 tablet  11  . niacin (SLO-NIACIN) 500 MG tablet Take 1,000 mg by mouth at bedtime.       Marland Kitchen testosterone cypionate (DEPOTESTOTERONE CYPIONATE) 200 MG/ML injection Inject 0.5 mLs (100 mg total) into the muscle every 14 (fourteen) days.  10 mL  0   Current Facility-Administered  Medications on File Prior to Visit  Medication Dose Route Frequency Provider Last Rate Last Dose  . DISCONTD: testosterone cypionate (DEPOTESTOTERONE CYPIONATE) injection 100 mg  100 mg Intramuscular Q14 Days Bradd Canary, MD   100 mg at 11/13/11 1425    Allergies  Allergen Reactions  . Wellbutrin (Bupropion Hcl)     irritable    Review of Systems  Review of Systems  Constitutional: Negative for fever and malaise/fatigue.  HENT: Negative for congestion.   Eyes: Negative for discharge.  Respiratory: Negative for shortness of breath.   Cardiovascular: Negative for chest pain, palpitations and leg swelling.  Gastrointestinal: Negative for nausea, abdominal pain and diarrhea.  Genitourinary: Negative for dysuria.  Musculoskeletal: Positive for joint pain. Negative for falls.       Left foot pain  Skin: Negative for rash.  Neurological: Negative for loss of consciousness and headaches.  Endo/Heme/Allergies: Negative for polydipsia.  Psychiatric/Behavioral: Negative for depression and suicidal ideas. The patient is not nervous/anxious and does not have insomnia.     Objective  BP 132/75  Pulse 90  Temp(Src) 97.2 F (36.2 C) (Temporal)  Ht 5' 7.75" (1.721 m)  Wt 211 lb 12.8 oz (96.072 kg)  BMI 32.44 kg/m2  SpO2 97%  Physical Exam  Physical Exam  Musculoskeletal: He exhibits tenderness. He exhibits no edema.       Tender over distal left metatarsals 2-5 with palp.     Lab Results  Component Value Date   TSH 1.313 09/15/2011   Lab Results  Component Value Date   WBC 10.4 09/15/2011   HGB 14.9 09/15/2011   HCT 44.0 09/15/2011   MCV 91.5 09/15/2011   PLT 312 09/15/2011   Lab Results  Component Value Date   CREATININE 0.72 09/15/2011   BUN 14 09/15/2011   NA 137 09/15/2011   K 4.3 09/15/2011   CL 100 09/15/2011   CO2 26 09/15/2011   Lab Results  Component Value Date   ALT 26 09/15/2011   AST 20 09/15/2011   ALKPHOS 49 09/15/2011   BILITOT 0.3 09/15/2011   Lab Results  Component  Value Date   CHOL 262* 09/15/2011   Lab Results  Component Value Date   HDL 49 09/15/2011   Lab Results  Component Value Date   LDLCALC 176* 09/15/2011   Lab Results  Component  Value Date   TRIG 187* 09/15/2011   Lab Results  Component Value Date   CHOLHDL 5.3 09/15/2011     Assessment & Plan  Foot pain, left Patient history of left foot pain but notably worse over the last 2 days. He denies any active trauma. Does work on his feet long hours and 2 days ago had sudden increase in his pain. Mild swelling perhaps but no redness or warmth. Pain hurts across the top of his distal metatarsals as well as around his lateral malleolus. 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 He is asked to get some shoe inserts. He is given ibuprofen 400 mg to use up to 3 times a day as needed for pain continue his Percocet use as well. He is given a work note for the last 2 days and asked to ice his foot roughly twice yearly and as needed.  HTN (hypertension) Adequately controlled today, no changes

## 2011-11-21 NOTE — Assessment & Plan Note (Signed)
Improving.

## 2011-11-21 NOTE — Assessment & Plan Note (Signed)
BS ranging from 88 to 140 in am, avoid simple carbs and maintain current meds

## 2011-11-21 NOTE — Patient Instructions (Signed)
Achilles Tendinitis Tendinitis a swelling and soreness of the tendon. The pain in the tendon (cord-like structure which attaches muscle to bone) is produced by tiny tears and the inflammation present in that tendon. It commonly occurs at the shoulders, heels, and elbows. It is usually caused by overusing the tendon and joint involved. Achilles tendinitis involves the Achilles tendon. This is the large tendon in the back of the leg just above the foot. It attaches the large muscles of the lower leg to the heel bone (called calcaneus).  This diagnosis (learning what is wrong) is made by examination. X-rays will be generally be normal if only tendinitis is present. HOME CARE INSTRUCTIONS   Apply ice to the injury for 15 to 20 minutes, 3 to 4 times per day. Put the ice in a plastic bag and place a towel between the bag of ice and your skin.   Try to avoid use other than gentle range of motion while the tendon is painful. Do not resume use until instructed by your caregiver. Then begin use gradually. Do not increase use to the point of pain. If pain does develop, decrease use and continue the above measures. Gradually increase activities that do not cause discomfort until you gradually achieve normal use.   Only take over-the-counter or prescription medicines for pain, discomfort, or fever as directed by your caregiver.  SEEK MEDICAL CARE IF:   Your pain and swelling increase or pain is uncontrolled with medications.   You develop new, unexplained problems (symptoms) or an increase of the symptoms that brought you to your caregiver.   You develop an inability to move your toes or foot, develop warmth and swelling in your foot, or begin running an unexplained temperature.  MAKE SURE YOU:   Understand these instructions.   Will watch your condition.   Will get help right away if you are not doing well or get worse.  Document Released: 05/10/2005 Document Revised: 07/20/2011 Document Reviewed:  03/18/2008 [facility] Patient Information 2012 [place], [place].  Ice foot 1-2 x daily and try the specialty shoe inserts

## 2011-11-21 NOTE — Assessment & Plan Note (Signed)
Adequately controlled today, no changes. 

## 2011-11-21 NOTE — Assessment & Plan Note (Signed)
Adequately controlled on current meds 

## 2011-11-21 NOTE — Assessment & Plan Note (Signed)
Patient history of left foot pain but notably worse over the last 2 days. He denies any active trauma. Does work on his feet long hours and 2 days ago had sudden increase in his pain. Mild swelling perhaps but no redness or warmth. Pain hurts across the top of his distal metatarsals as well as around his lateral malleolus. 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 He is asked to get some shoe inserts. He is given ibuprofen 400 mg to use up to 3 times a day as needed for pain continue his Percocet use as well. He is given a work note for the last 2 days and asked to ice his foot roughly twice yearly and as needed.

## 2011-11-21 NOTE — Assessment & Plan Note (Signed)
Using CPAP 

## 2011-11-21 NOTE — Assessment & Plan Note (Signed)
Tolerating injections and he does believe he is starting to feel better and less fatigued

## 2011-11-22 LAB — URIC ACID: Uric Acid, Serum: 8.1 mg/dL — ABNORMAL HIGH (ref 4.0–7.8)

## 2011-11-24 ENCOUNTER — Ambulatory Visit: Payer: 59

## 2011-11-28 ENCOUNTER — Ambulatory Visit: Payer: 59

## 2011-11-30 ENCOUNTER — Encounter: Payer: Self-pay | Admitting: Family Medicine

## 2011-11-30 ENCOUNTER — Ambulatory Visit (INDEPENDENT_AMBULATORY_CARE_PROVIDER_SITE_OTHER): Payer: 59 | Admitting: Family Medicine

## 2011-11-30 VITALS — BP 136/92 | HR 84 | Temp 98.1°F | Ht 67.75 in | Wt 216.8 lb

## 2011-11-30 DIAGNOSIS — E349 Endocrine disorder, unspecified: Secondary | ICD-10-CM

## 2011-11-30 DIAGNOSIS — E291 Testicular hypofunction: Secondary | ICD-10-CM

## 2011-11-30 DIAGNOSIS — M1990 Unspecified osteoarthritis, unspecified site: Secondary | ICD-10-CM

## 2011-11-30 DIAGNOSIS — M79672 Pain in left foot: Secondary | ICD-10-CM

## 2011-11-30 DIAGNOSIS — I10 Essential (primary) hypertension: Secondary | ICD-10-CM

## 2011-11-30 DIAGNOSIS — M79609 Pain in unspecified limb: Secondary | ICD-10-CM

## 2011-11-30 DIAGNOSIS — Z720 Tobacco use: Secondary | ICD-10-CM

## 2011-11-30 DIAGNOSIS — F17200 Nicotine dependence, unspecified, uncomplicated: Secondary | ICD-10-CM

## 2011-11-30 HISTORY — DX: Unspecified osteoarthritis, unspecified site: M19.90

## 2011-11-30 MED ORDER — TESTOSTERONE CYPIONATE 200 MG/ML IM SOLN
100.0000 mg | INTRAMUSCULAR | Status: DC
  Administered 2011-11-30: 100 mg via INTRAMUSCULAR

## 2011-11-30 NOTE — Assessment & Plan Note (Addendum)
Mildly elevated, has not slept for 20 hours will reassess at next visit

## 2011-11-30 NOTE — Assessment & Plan Note (Signed)
Family history of RA, will check Rf with next set of labs, struggles with diffuse symmetric pain

## 2011-11-30 NOTE — Patient Instructions (Signed)

## 2011-11-30 NOTE — Assessment & Plan Note (Signed)
Unfortunately continues to smoke a PPD, has been under a tremendous amount of stress since he and his wife split last month

## 2011-11-30 NOTE — Assessment & Plan Note (Signed)
Given his shot today, will recheck levels next month

## 2011-11-30 NOTE — Progress Notes (Signed)
Patient ID: Scott Sosa, male   DOB: 1961-02-28, 51 y.o.   MRN: [id number] Scott Sosa [id number] [birth year] 11/30/2011      Progress Note-Follow Up  Subjective  Chief Complaint  Chief Complaint  Patient presents with  . Follow-up    foot still hurts- left  . Injections    testosterone    HPI  51 year old Caucasian male who is in for his testosterone injection and left foot pain. At his last visit we discussed the fact that he had fallen arches and should get some special issues answered. He is having trouble financially and pathology. Did just get some cheaper inserts and actually wore them off at work, feels somewhat better today than he's felt in a while. The pain is largely over his metatarsals diffusely. Continues to smoke a ppd and has been under a great deal of stress due to separating from his wife last month. Denies CP/palp/SOB/GI or GU c/o.  Past Medical History  Diagnosis Date  . Allergy     seasonal  . Diabetes mellitus 7-12  . Hypertension   . Chronic lower back pain   . Chicken pox as a teenager  . Arthritis   . Hyperlipidemia   . Low back pain 06/14/2011  . Neck pain, chronic 06/14/2011  . Overweight 06/14/2011  . HTN (hypertension) 06/14/2011  . Tobacco abuse 06/14/2011  . 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 Diabetes mellitus 06/14/2011  . Allergic state 06/14/2011  . Vitamin d deficiency   . Tinea corporis 06/20/2011  . Folliculitis 07/13/2011  . Fatigue 07/13/2011  . Anxiety and depression 08/12/2011  . Sleep apnea 09/25/2011  . Testosterone deficiency 09/25/2011  . Foot pain, left 11/21/2011  . OA (osteoarthritis) 11/30/2011    History reviewed. No pertinent past surgical history.  Family History  Problem Relation Age of Onset  . Hyperlipidemia Mother   . Diabetes Mother     type 2  . Thyroid disease Mother   . Arthritis Mother     crippling  . Hyperlipidemia Father   . Cancer Father     gum/ smoked cigars  . Arthritis Sister     rheumatoid  .  Other Sister     heart arrythmia  . Heart disease Sister   . Other Sister     bad back  . Arthritis Sister     chronic    History   Social History  . Marital Status: Married    Spouse Name: N/A    Number of Children: N/A  . Years of Education: N/A   Occupational History  . Not on file.   Social History Main Topics  . Smoking status: Current Everyday Smoker -- 0.5 packs/day for 30 years    Types: Cigarettes  . Smokeless tobacco: Never Used  . Alcohol Use: Yes     12 pack of beer a week  . Drug Use: No  . Sexually Active: Yes -- Male partner(s)   Other Topics Concern  . Not on file   Social History Narrative  . No narrative on file    Current Outpatient Prescriptions on File Prior to Visit  Medication Sig Dispense Refill  . amLODipine (NORVASC) 10 MG tablet Take 5 mg by mouth daily.       . carisoprodol (SOMA) 350 MG tablet TAKE ONE TABLET BY MOUTH THREE TIMES DAILY AS NEEDED FOR MUSCLE SPASM  60 tablet  0  . Cholecalciferol 1000 UNITS tablet Take 1,000 Units by mouth daily.        Marland Kitchen  ibuprofen (ADVIL,MOTRIN) 400 MG tablet Take 1 tablet (400 mg total) by mouth every 8 (eight) hours as needed for pain.  90 tablet  1  . lisinopril-hydrochlorothiazide (PRINZIDE,ZESTORETIC) 20-25 MG per tablet Take 1 tablet by mouth daily.        Marland Kitchen loratadine (CLARITIN) 10 MG tablet Take 10 mg by mouth daily.        Marland Kitchen lovastatin (MEVACOR) 10 MG tablet Take 1 tablet (10 mg total) by mouth at bedtime.  30 tablet  3  . metFORMIN (GLUCOPHAGE) 500 MG tablet Take 500 mg by mouth 2 (two) times daily with a meal.        . metoprolol succinate (TOPROL XL) 25 MG 24 hr tablet Take 1 tablet (25 mg total) by mouth daily.  30 tablet  11  . niacin (SLO-NIACIN) 500 MG tablet Take 1,000 mg by mouth at bedtime.       Marland Kitchen oxyCODONE-acetaminophen (PERCOCET) 10-325 MG per tablet Take 1 tablet by mouth every 8 (eight) hours as needed for pain.  90 tablet  0  . testosterone cypionate (DEPOTESTOTERONE CYPIONATE)  200 MG/ML injection Inject 0.5 mLs (100 mg total) into the muscle every 14 (fourteen) days.  10 mL  0   No current facility-administered medications on file prior to visit.    Allergies  Allergen Reactions  . Wellbutrin (Bupropion Hcl)     irritable    Review of Systems  Review of Systems  Constitutional: Positive for malaise/fatigue. Negative for fever.  HENT: Negative for congestion.   Eyes: Negative for discharge.  Respiratory: Negative for shortness of breath.   Cardiovascular: Negative for chest pain, palpitations and leg swelling.  Gastrointestinal: Negative for nausea, abdominal pain and diarrhea.  Genitourinary: Negative for dysuria.  Musculoskeletal: Positive for joint pain. Negative for falls.       Left foot pain persists  Skin: Negative for rash.  Neurological: Negative for loss of consciousness and headaches.  Endo/Heme/Allergies: Negative for polydipsia.  Psychiatric/Behavioral: Negative for depression and suicidal ideas. The patient is not nervous/anxious and does not have insomnia.     Objective  BP 136/92  Pulse 84  Temp(Src) 98.1 F (36.7 C) (Temporal)  Ht 5' 7.75" (1.721 m)  Wt 216 lb 12.8 oz (98.34 kg)  BMI 33.21 kg/m2  SpO2 96%  Physical Exam  Physical Exam  Constitutional: He is oriented to person, place, and time and well-developed, well-nourished, and in no distress. No distress.  HENT:  Head: Normocephalic and atraumatic.  Eyes: Conjunctivae are normal.  Neck: Neck supple. No thyromegaly present.  Cardiovascular: Normal rate, regular rhythm and normal heart sounds.   No murmur heard. Pulmonary/Chest: Effort normal and breath sounds normal. No respiratory distress.  Abdominal: He exhibits no distension and no mass. There is no tenderness.  Musculoskeletal: He exhibits no edema.  Neurological: He is alert and oriented to person, place, and time.  Skin: Skin is warm.  Psychiatric: Memory, affect and judgment normal.    Lab Results    Component Value Date   TSH 1.313 09/15/2011   Lab Results  Component Value Date   WBC 10.4 09/15/2011   HGB 14.9 09/15/2011   HCT 44.0 09/15/2011   MCV 91.5 09/15/2011   PLT 312 09/15/2011   Lab Results  Component Value Date   CREATININE 0.72 09/15/2011   BUN 14 09/15/2011   NA 137 09/15/2011   K 4.3 09/15/2011   CL 100 09/15/2011   CO2 26 09/15/2011   Lab Results  Component Value  Date   ALT 26 09/15/2011   AST 20 09/15/2011   ALKPHOS 49 09/15/2011   BILITOT 0.3 09/15/2011   Lab Results  Component Value Date   CHOL 262* 09/15/2011   Lab Results  Component Value Date   HDL 49 09/15/2011   Lab Results  Component Value Date   LDLCALC 176* 09/15/2011   Lab Results  Component Value Date   TRIG 187* 09/15/2011   Lab Results  Component Value Date   CHOLHDL 5.3 09/15/2011     Assessment & Plan  HTN (hypertension) Mildly elevated, has not slept for 20 hours will reassess at next visit  Testosterone deficiency Given his shot today, will recheck levels next month  Foot pain, left Has just gotten a cheaper arch support and it is helping some, encouraged Aspercreme and ice bid. Continue his other meds prn  Tobacco abuse Unfortunately continues to smoke a PPD, has been under a tremendous amount of stress since he and his wife split last month  OA (osteoarthritis) Family history of RA, will check Rf with next set of labs, struggles with diffuse symmetric pain

## 2011-11-30 NOTE — Assessment & Plan Note (Signed)
Has just gotten a cheaper arch support and it is helping some, encouraged Aspercreme and ice bid. Continue his other meds prn

## 2011-12-04 ENCOUNTER — Encounter: Payer: Self-pay | Admitting: Family Medicine

## 2011-12-13 ENCOUNTER — Ambulatory Visit: Payer: 59 | Admitting: Family Medicine

## 2011-12-15 ENCOUNTER — Ambulatory Visit (INDEPENDENT_AMBULATORY_CARE_PROVIDER_SITE_OTHER): Payer: 59 | Admitting: Family Medicine

## 2011-12-15 ENCOUNTER — Other Ambulatory Visit: Payer: Self-pay | Admitting: Family Medicine

## 2011-12-15 ENCOUNTER — Encounter: Payer: Self-pay | Admitting: Family Medicine

## 2011-12-15 VITALS — BP 124/84 | HR 78 | Temp 98.4°F | Ht 67.75 in | Wt 208.0 lb

## 2011-12-15 DIAGNOSIS — G8929 Other chronic pain: Secondary | ICD-10-CM

## 2011-12-15 DIAGNOSIS — I10 Essential (primary) hypertension: Secondary | ICD-10-CM

## 2011-12-15 DIAGNOSIS — E7800 Pure hypercholesterolemia, unspecified: Secondary | ICD-10-CM

## 2011-12-15 DIAGNOSIS — E785 Hyperlipidemia, unspecified: Secondary | ICD-10-CM

## 2011-12-15 DIAGNOSIS — R7989 Other specified abnormal findings of blood chemistry: Secondary | ICD-10-CM

## 2011-12-15 DIAGNOSIS — E349 Endocrine disorder, unspecified: Secondary | ICD-10-CM

## 2011-12-15 DIAGNOSIS — M545 Low back pain, unspecified: Secondary | ICD-10-CM

## 2011-12-15 DIAGNOSIS — M79672 Pain in left foot: Secondary | ICD-10-CM

## 2011-12-15 DIAGNOSIS — E291 Testicular hypofunction: Secondary | ICD-10-CM

## 2011-12-15 DIAGNOSIS — E119 Type 2 diabetes mellitus without complications: Secondary | ICD-10-CM

## 2011-12-15 DIAGNOSIS — M79609 Pain in unspecified limb: Secondary | ICD-10-CM

## 2011-12-15 DIAGNOSIS — M542 Cervicalgia: Secondary | ICD-10-CM

## 2011-12-15 DIAGNOSIS — M1990 Unspecified osteoarthritis, unspecified site: Secondary | ICD-10-CM

## 2011-12-15 MED ORDER — TESTOSTERONE CYPIONATE 200 MG/ML IM SOLN
100.0000 mg | INTRAMUSCULAR | Status: DC
  Administered 2011-12-15: 100 mg via INTRAMUSCULAR

## 2011-12-15 MED ORDER — OXYCODONE-ACETAMINOPHEN 10-325 MG PO TABS: 1.0000 | ORAL_TABLET | Freq: Three times a day (TID) | ORAL | Status: DC | PRN

## 2011-12-15 NOTE — Assessment & Plan Note (Signed)
Given testosterone injection today. Tolerating well

## 2011-12-15 NOTE — Assessment & Plan Note (Addendum)
Chronic pain plagues him, feeling somewhat better.

## 2011-12-15 NOTE — Assessment & Plan Note (Signed)
Sugars ranging from 92 to 136. Check hgba1c

## 2011-12-15 NOTE — Progress Notes (Signed)
Patient ID: Scott Sosa, male   DOB: 1961/03/24, 51 y.o.   MRN: [id number] IORI GIGANTE [id number] [birth year] 12/15/2011      Progress Note-Follow Up  Subjective  Chief Complaint  Chief Complaint  Patient presents with  . Follow-up    1 month    HPI  Patient is a 51 year old male in today for follow up. No recent illness or chest pain. His foot pain is greatly better with the addition of shoe inserts and using Aspercreme and ice on a regular basis. His blood sugars are in better ranging from 9135. No recent fevers or chills. No chest pain or palpitations. No shortness of breath GI or GU complaints. He does struggle with some fatigue but says it is improving.  Past Medical History  Diagnosis Date  . Allergy     seasonal  . Diabetes mellitus 7-12  . Hypertension   . Chronic lower back pain   . Chicken pox as a teenager  . Arthritis   . Hyperlipidemia   . Low back pain 06/14/2011  . Neck pain, chronic 06/14/2011  . Overweight 06/14/2011  . HTN (hypertension) 06/14/2011  . Tobacco abuse 06/14/2011  . 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 Diabetes mellitus 06/14/2011  . Allergic state 06/14/2011  . Vitamin d deficiency   . Tinea corporis 06/20/2011  . Folliculitis 07/13/2011  . Fatigue 07/13/2011  . Anxiety and depression 08/12/2011  . Sleep apnea 09/25/2011  . Testosterone deficiency 09/25/2011  . Foot pain, left 11/21/2011  . OA (osteoarthritis) 11/30/2011    History reviewed. No pertinent past surgical history.  Family History  Problem Relation Age of Onset  . Hyperlipidemia Mother   . Diabetes Mother     type 2  . Thyroid disease Mother   . Arthritis Mother     crippling  . Hyperlipidemia Father   . Cancer Father     gum/ smoked cigars  . Arthritis Sister     rheumatoid  . Other Sister     heart arrythmia  . Heart disease Sister   . Other Sister     bad back  . Arthritis Sister     chronic    History   Social History  . Marital Status: Married    Spouse  Name: N/A    Number of Children: N/A  . Years of Education: N/A   Occupational History  . Not on file.   Social History Main Topics  . Smoking status: Current Everyday Smoker -- 0.5 packs/day for 30 years    Types: Cigarettes  . Smokeless tobacco: Never Used  . Alcohol Use: Yes     12 pack of beer a week  . Drug Use: No  . Sexually Active: Yes -- Male partner(s)   Other Topics Concern  . Not on file   Social History Narrative  . No narrative on file    Current Outpatient Prescriptions on File Prior to Visit  Medication Sig Dispense Refill  . amLODipine (NORVASC) 10 MG tablet Take 5 mg by mouth daily.       . carisoprodol (SOMA) 350 MG tablet TAKE ONE TABLET BY MOUTH THREE TIMES DAILY AS NEEDED FOR MUSCLE SPASM  60 tablet  0  . Cholecalciferol 1000 UNITS tablet Take 1,000 Units by mouth daily.        Marland Kitchen lisinopril-hydrochlorothiazide (PRINZIDE,ZESTORETIC) 20-25 MG per tablet Take 1 tablet by mouth daily.        Marland Kitchen loratadine (CLARITIN) 10 MG tablet Take  10 mg by mouth daily.        Marland Kitchen lovastatin (MEVACOR) 10 MG tablet Take 1 tablet (10 mg total) by mouth at bedtime.  30 tablet  3  . metFORMIN (GLUCOPHAGE) 500 MG tablet Take 500 mg by mouth 2 (two) times daily with a meal.        . metoprolol succinate (TOPROL XL) 25 MG 24 hr tablet Take 1 tablet (25 mg total) by mouth daily.  30 tablet  11  . niacin (SLO-NIACIN) 500 MG tablet Take 1,000 mg by mouth at bedtime.       Marland Kitchen testosterone cypionate (DEPOTESTOTERONE CYPIONATE) 200 MG/ML injection Inject 0.5 mLs (100 mg total) into the muscle every 14 (fourteen) days.  10 mL  0   No current facility-administered medications on file prior to visit.    Allergies  Allergen Reactions  . Wellbutrin (Bupropion Hcl)     irritable    Review of Systems  Review of Systems  Constitutional: Positive for malaise/fatigue. Negative for fever.  HENT: Negative for congestion.   Eyes: Negative for discharge.  Respiratory: Negative for shortness  of breath.   Cardiovascular: Negative for chest pain, palpitations and leg swelling.  Gastrointestinal: Negative for nausea, abdominal pain and diarrhea.  Genitourinary: Negative for dysuria.  Musculoskeletal: Positive for joint pain. Negative for falls.  Skin: Negative for rash.  Neurological: Negative for loss of consciousness and headaches.  Endo/Heme/Allergies: Negative for polydipsia.  Psychiatric/Behavioral: Negative for depression and suicidal ideas. The patient is not nervous/anxious and does not have insomnia.     Objective  BP 124/84  Pulse 78  Temp(Src) 98.4 F (36.9 C) (Temporal)  Ht 5' 7.75" (1.721 m)  Wt 208 lb (94.348 kg)  BMI 31.86 kg/m2  SpO2 97%  Physical Exam  Physical Exam  Constitutional: He is oriented to person, place, and time and well-developed, well-nourished, and in no distress. No distress.  HENT:  Head: Normocephalic and atraumatic.  Eyes: Conjunctivae are normal.  Neck: Neck supple. No thyromegaly present.  Cardiovascular: Normal rate, regular rhythm and normal heart sounds.   Pulmonary/Chest: Effort normal and breath sounds normal. No respiratory distress.  Abdominal: He exhibits no distension and no mass. There is no tenderness.  Musculoskeletal: He exhibits no edema.  Neurological: He is alert and oriented to person, place, and time.  Skin: Skin is warm.  Psychiatric: Memory, affect and judgment normal.    Lab Results  Component Value Date   TSH 1.313 09/15/2011   Lab Results  Component Value Date   WBC 10.4 09/15/2011   HGB 14.9 09/15/2011   HCT 44.0 09/15/2011   MCV 91.5 09/15/2011   PLT 312 09/15/2011   Lab Results  Component Value Date   CREATININE 0.72 09/15/2011   BUN 14 09/15/2011   NA 137 09/15/2011   K 4.3 09/15/2011   CL 100 09/15/2011   CO2 26 09/15/2011   Lab Results  Component Value Date   ALT 26 09/15/2011   AST 20 09/15/2011   ALKPHOS 49 09/15/2011   BILITOT 0.3 09/15/2011   Lab Results  Component Value Date   CHOL 262* 09/15/2011    Lab Results  Component Value Date   HDL 49 09/15/2011   Lab Results  Component Value Date   LDLCALC 176* 09/15/2011   Lab Results  Component Value Date   TRIG 187* 09/15/2011   Lab Results  Component Value Date   CHOLHDL 5.3 09/15/2011     Assessment & Plan  Testosterone deficiency  Given testosterone injection today. Tolerating well  OA (osteoarthritis) Chronic pain plagues him, feeling somewhat better.  Foot pain, left Improving with shoe inserts, aspercreme rubs, no changes otherwise. Given refill on Percocet today  Diabetes mellitus Sugars ranging from 92 to 136. Check hgba1c  HTN (hypertension) Well controlled no changes

## 2011-12-15 NOTE — Assessment & Plan Note (Signed)
Well controlled no changes 

## 2011-12-15 NOTE — Assessment & Plan Note (Signed)
Improving with shoe inserts, aspercreme rubs, no changes otherwise. Given refill on Percocet today

## 2011-12-15 NOTE — Patient Instructions (Signed)

## 2011-12-16 LAB — LIPID PANEL
Cholesterol: 215 mg/dL — ABNORMAL HIGH (ref 0–200)
HDL: 45 mg/dL (ref >39)
LDL Cholesterol: 126 mg/dL — ABNORMAL HIGH (ref 0–99)
Triglycerides: 221 mg/dL — ABNORMAL HIGH (ref <150)

## 2011-12-16 LAB — HEPATIC FUNCTION PANEL
ALT: 20 U/L (ref 0–53)
AST: 21 U/L (ref 0–37)
Albumin: 4.5 g/dL (ref 3.5–5.2)
Alkaline Phosphatase: 48 U/L (ref 39–117)
Indirect Bilirubin: 0.2 mg/dL (ref 0.0–0.9)
Total Protein: 6.7 g/dL (ref 6.0–8.3)

## 2011-12-16 LAB — BASIC METABOLIC PANEL
BUN: 14 mg/dL (ref 6–23)
Calcium: 9.2 mg/dL (ref 8.4–10.5)
Chloride: 101 mEq/L (ref 96–112)
Creat: 0.68 mg/dL (ref 0.50–1.35)

## 2011-12-16 LAB — TESTOSTERONE: Testosterone: 139.87 ng/dL — ABNORMAL LOW (ref 300–890)

## 2011-12-16 LAB — HEMOGLOBIN A1C: Mean Plasma Glucose: 117 mg/dL — ABNORMAL HIGH (ref <117)

## 2011-12-18 MED ORDER — LOVASTATIN 20 MG PO TABS: 20.0000 mg | ORAL_TABLET | Freq: Every day | ORAL | Status: AC

## 2011-12-18 MED ORDER — TESTOSTERONE CYPIONATE 200 MG/ML IM SOLN: 200.0000 mg | INTRAMUSCULAR | Status: DC

## 2011-12-18 NOTE — Progress Notes (Signed)
Addended by: Court Joy on: 12/18/2011 11:31 AM   Modules accepted: Orders

## 2011-12-26 ENCOUNTER — Telehealth: Payer: Self-pay | Admitting: Family Medicine

## 2011-12-26 ENCOUNTER — Encounter: Payer: Self-pay | Admitting: Family Medicine

## 2011-12-26 ENCOUNTER — Ambulatory Visit (INDEPENDENT_AMBULATORY_CARE_PROVIDER_SITE_OTHER): Payer: 59 | Admitting: Family Medicine

## 2011-12-26 VITALS — BP 122/86 | HR 97 | Temp 98.0°F | Ht 67.75 in | Wt 208.8 lb

## 2011-12-26 DIAGNOSIS — I10 Essential (primary) hypertension: Secondary | ICD-10-CM

## 2011-12-26 DIAGNOSIS — M549 Dorsalgia, unspecified: Secondary | ICD-10-CM

## 2011-12-26 DIAGNOSIS — M545 Low back pain: Secondary | ICD-10-CM

## 2011-12-26 MED ORDER — CYCLOBENZAPRINE HCL 10 MG PO TABS: 10.0000 mg | ORAL_TABLET | Freq: Two times a day (BID) | ORAL | Status: AC | PRN

## 2011-12-26 NOTE — Telephone Encounter (Signed)
Patients wife stated that she is not at home and pts cell is out of minutes. Patients wife will let patient know he will need to come in for an appt.

## 2011-12-26 NOTE — Telephone Encounter (Signed)
Patient came in for an OV 12/26/11

## 2011-12-26 NOTE — Telephone Encounter (Signed)
Patient had a lot of trouble with his back after working in the yard. He stayed out of work yesterday. Can he get a note for yesterday?

## 2011-12-26 NOTE — Patient Instructions (Signed)
Sacroiliac Joint Dysfunction The sacroiliac joint connects the lower part of the spine (the sacrum) with the bones of the pelvis. CAUSES  Sometimes, there is no obvious reason for sacroiliac joint dysfunction. Other times, it may occur   During pregnancy.   After injury, such as:   Car accidents.   Sport-related injuries.   Work-related injuries.   Due to one leg being shorter than the other.   Due to other conditions that affect the joints, such as:   Rheumatoid arthritis.   Gout.   Psoriasis.   Joint infection (septic arthritis).  SYMPTOMS  Symptoms may include:  Pain in the:   Lower back.   Buttocks.   Groin.   Thighs and legs.   Difficult sitting, standing, walking, lying, bending or lifting.  DIAGNOSIS  A number of tests may be used to help diagnose the cause of sacroiliac joint dysfunction, including:  Imaging tests to look for other causes of pain, including:   MRI.   CT scan.   Bone scan.   Diagnostic injection: During a special x-ray (called fluoroscopy), a needle is put into the sacroiliac joint. A numbing medicine is injected into the joint. If the pain is improved or stopped, the diagnosis of sacroiliac joint dysfunction is more likely.  TREATMENT  There are a number of types of treatment used for sacroiliac joint dysfunction, including:  Only take over-the-counter or prescription medicines for pain, discomfort, or fever as directed by your caregiver.   Medications to relax muscles.   Rest. Decreasing activity can help cut down on painful muscle spasms and allow the back to heal.   Application of heat or ice to the lower back may improve muscle spasms and soothe pain.   Brace. A special back brace, called a sacroiliac belt, can help support the joint while your back is healing.   Physical therapy can help teach comfortable positions and exercises to strengthen muscles that support the sacroiliac joint.   Cortisone injections. Injections  of steroid medicine into the joint can help decrease swelling and improve pain.   Hyaluronic acid injections. This chemical improves lubrication within the sacroiliac joint, thereby decreasing pain.   Radiofrequency ablation. A special needle is placed into the joint, where it burns away nerves that are carrying pain messages from the joint.   Surgery. Because pain occurs during movement of the joint, screws and plates may be installed in order to limit or prevent joint motion.  HOME CARE INSTRUCTIONS   Take all medications exactly as directed.   Follow instructions regarding both rest and physical activity, to avoid worsening the pain.   Do physical therapy exercises exactly as prescribed.  SEEK IMMEDIATE MEDICAL CARE IF:  You experience increasingly severe pain.   You develop new symptoms, such as numbness or tingling in your legs or feet.   You lose bladder or bowel control.  Document Released: 10/27/2008 Document Revised: 07/20/2011 Document Reviewed: 10/27/2008 [employer] Patient Information 2012 [employer] 

## 2011-12-27 NOTE — Assessment & Plan Note (Signed)
Patient had a flare with his back pain this week after over exerting himself in his yard this past weekend. He did to take the [facility] as it causes nausea so we will discontinue this and allow Cyclobenzaprine instead. Is given a work note to cover yesterday and will return if pain worsens again

## 2011-12-27 NOTE — Progress Notes (Signed)
Patient ID: Scott Sosa, male   DOB: [birth year], 51 y.o.   MRN: [id number] Scott Sosa [id number] [birth year] 12/27/2011      Progress Note-Follow Up  Subjective  Chief Complaint  Chief Complaint  Patient presents with  . Back Pain    needs a note for missing work last night    HPI  Patient is a 51 year old Caucasian male who is in today for evaluation of low back pain. He was working on his lawnmower and wheezing over the weekend and had a flare in his low back pain to the point that he had difficulty moving. He was offered the beginning of the week but ultimately missed work yesterday due to his persistent pain. No radicular symptoms or incontinence but severe low back pain limiting his movement was noted. He was unable to take the soma as it causes nausea. Today the pain is still present but improving and he feels he can return to work. No chest pain or palpitations, GI or GU complaints noted.  Past Medical History  Diagnosis Date  . Allergy     seasonal  . Diabetes mellitus 7-12  . Hypertension   . Chronic lower back pain   . Chicken pox as a teenager  . Arthritis   . Hyperlipidemia   . Low back pain 06/14/2011  . Neck pain, chronic 06/14/2011  . Overweight 06/14/2011  . HTN (hypertension) 06/14/2011  . Tobacco abuse 06/14/2011  . 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 Diabetes mellitus 06/14/2011  . Allergic state 06/14/2011  . Vitamin d deficiency   . Tinea corporis 06/20/2011  . Folliculitis 07/13/2011  . Fatigue 07/13/2011  . Anxiety and depression 08/12/2011  . Sleep apnea 09/25/2011  . Testosterone deficiency 09/25/2011  . Foot pain, left 11/21/2011  . OA (osteoarthritis) 11/30/2011    No past surgical history on file.  Family History  Problem Relation Age of Onset  . Hyperlipidemia Mother   . Diabetes Mother     type 2  . Thyroid disease Mother   . Arthritis Mother     crippling  . Hyperlipidemia Father   . Cancer Father     gum/ smoked cigars  . Arthritis  Sister     rheumatoid  . Other Sister     heart arrythmia  . Heart disease Sister   . Other Sister     bad back  . Arthritis Sister     chronic    History   Social History  . Marital Status: Married    Spouse Name: N/A    Number of Children: N/A  . Years of Education: N/A   Occupational History  . Not on file.   Social History Main Topics  . Smoking status: Current Everyday Smoker -- 0.5 packs/day for 30 years    Types: Cigarettes  . Smokeless tobacco: Never Used  . Alcohol Use: Yes     12 pack of beer a week  . Drug Use: No  . Sexually Active: Yes -- Male partner(s)   Other Topics Concern  . Not on file   Social History Narrative  . No narrative on file    Current Outpatient Prescriptions on File Prior to Visit  Medication Sig Dispense Refill  . amLODipine (NORVASC) 10 MG tablet Take 5 mg by mouth daily.       . Cholecalciferol 1000 UNITS tablet Take 1,000 Units by mouth daily.        Marland Kitchen lisinopril-hydrochlorothiazide (PRINZIDE,ZESTORETIC) 20-25 MG per tablet Take 1  tablet by mouth daily.        Marland Kitchen loratadine (CLARITIN) 10 MG tablet Take 10 mg by mouth daily.        Marland Kitchen lovastatin (MEVACOR) 20 MG tablet Take 1 tablet (20 mg total) by mouth at bedtime.  90 tablet  0  . metFORMIN (GLUCOPHAGE) 500 MG tablet Take 500 mg by mouth 2 (two) times daily with a meal.        . metoprolol succinate (TOPROL XL) 25 MG 24 hr tablet Take 1 tablet (25 mg total) by mouth daily.  30 tablet  11  . niacin (SLO-NIACIN) 500 MG tablet Take 1,000 mg by mouth at bedtime.       Marland Kitchen oxyCODONE-acetaminophen (PERCOCET) 10-325 MG per tablet Take 1 tablet by mouth every 8 (eight) hours as needed for pain.  90 tablet  0  . testosterone cypionate (DEPOTESTOTERONE CYPIONATE) 200 MG/ML injection Inject 1 mL (200 mg total) into the muscle every 14 (fourteen) days.  10 mL  0    Allergies  Allergen Reactions  . Wellbutrin (Bupropion Hcl)     irritable    Review of Systems  Review of Systems    Constitutional: Negative for fever and malaise/fatigue.  HENT: Negative for congestion.   Eyes: Negative for discharge.  Respiratory: Negative for shortness of breath.   Cardiovascular: Negative for chest pain, palpitations and leg swelling.  Gastrointestinal: Negative for nausea, abdominal pain and diarrhea.  Genitourinary: Negative for dysuria.  Musculoskeletal: Positive for back pain. Negative for falls.  Skin: Negative for rash.  Neurological: Negative for loss of consciousness and headaches.  Endo/Heme/Allergies: Negative for polydipsia.  Psychiatric/Behavioral: Negative for depression and suicidal ideas. The patient is not nervous/anxious and does not have insomnia.     Objective  BP 122/86  Pulse 97  Temp(Src) 98 F (36.7 C) (Temporal)  Ht 5' 7.75" (1.721 m)  Wt 208 lb 12.8 oz (94.711 kg)  BMI 31.98 kg/m2  SpO2 97%  Physical Exam  Physical Exam  Constitutional: He is oriented to person, place, and time and well-developed, well-nourished, and in no distress. No distress.  HENT:  Head: Normocephalic and atraumatic.  Eyes: Conjunctivae are normal.  Neck: Neck supple. No thyromegaly present.  Cardiovascular: Normal rate, regular rhythm and normal heart sounds.   No murmur heard. Pulmonary/Chest: Effort normal and breath sounds normal. No respiratory distress.  Abdominal: He exhibits no distension and no mass. There is no tenderness.  Musculoskeletal: He exhibits tenderness. He exhibits no edema.       Mild pain with palpation over b/l posterior iliac joints with palpation, no pain with palp over spine itself  Neurological: He is alert and oriented to person, place, and time.  Skin: Skin is warm.  Psychiatric: Memory, affect and judgment normal.    Lab Results  Component Value Date   TSH 1.313 09/15/2011   Lab Results  Component Value Date   WBC 10.4 09/15/2011   HGB 14.9 09/15/2011   HCT 44.0 09/15/2011   MCV 91.5 09/15/2011   PLT 312 09/15/2011   Lab Results   Component Value Date   CREATININE 0.68 12/15/2011   BUN 14 12/15/2011   NA 139 12/15/2011   K 4.2 12/15/2011   CL 101 12/15/2011   CO2 30 12/15/2011   Lab Results  Component Value Date   ALT 20 12/15/2011   AST 21 12/15/2011   ALKPHOS 48 12/15/2011   BILITOT 0.3 12/15/2011   Lab Results  Component Value Date  CHOL 215* 12/15/2011   Lab Results  Component Value Date   HDL 45 12/15/2011   Lab Results  Component Value Date   LDLCALC 126* 12/15/2011   Lab Results  Component Value Date   TRIG 221* 12/15/2011   Lab Results  Component Value Date   CHOLHDL 4.8 12/15/2011     Assessment & Plan  Low back pain Patient had a flare with his back pain this week after over exerting himself in his yard this past weekend. He did to take the [facility] as it causes nausea so we will discontinue this and allow Cyclobenzaprine instead. Is given a work note to cover yesterday and will return if pain worsens again  HTN (hypertension) Adequately controlled despite pain today

## 2011-12-27 NOTE — Assessment & Plan Note (Signed)
Adequately controlled despite pain today

## 2011-12-29 ENCOUNTER — Ambulatory Visit: Payer: 59

## 2012-01-01 ENCOUNTER — Ambulatory Visit: Payer: 59

## 2012-01-03 ENCOUNTER — Ambulatory Visit (INDEPENDENT_AMBULATORY_CARE_PROVIDER_SITE_OTHER): Payer: 59

## 2012-01-03 DIAGNOSIS — E349 Endocrine disorder, unspecified: Secondary | ICD-10-CM

## 2012-01-03 DIAGNOSIS — E291 Testicular hypofunction: Secondary | ICD-10-CM

## 2012-01-03 MED ORDER — TESTOSTERONE CYPIONATE 200 MG/ML IM SOLN
200.0000 mg | INTRAMUSCULAR | Status: DC
  Administered 2012-01-03: 200 mg via INTRAMUSCULAR

## 2012-01-03 NOTE — Progress Notes (Signed)
  Subjective:    Patient ID: Scott Sosa, male    DOB: September 28, 1960, 51 y.o.   MRN: [id number]  HPI    Review of Systems     Objective:   Physical Exam        Assessment & Plan:  Patient came in for testosterone injection. Patient handled injection good

## 2012-01-11 ENCOUNTER — Other Ambulatory Visit: Payer: Self-pay | Admitting: Emergency Medicine

## 2012-01-11 DIAGNOSIS — M542 Cervicalgia: Secondary | ICD-10-CM

## 2012-01-11 DIAGNOSIS — M545 Low back pain: Secondary | ICD-10-CM

## 2012-01-11 MED ORDER — OXYCODONE-ACETAMINOPHEN 10-325 MG PO TABS: 1.0000 | ORAL_TABLET | Freq: Three times a day (TID) | ORAL | Status: DC | PRN

## 2012-01-11 NOTE — Telephone Encounter (Signed)
OK to refill his Oxycodone with same sig and same number

## 2012-01-11 NOTE — Telephone Encounter (Signed)
Patient informed and RX put in front cabinet

## 2012-01-11 NOTE — Telephone Encounter (Signed)
Please advise refill? Last RX wrote on 12-15-11 quantity 90 with no refills

## 2012-01-17 ENCOUNTER — Ambulatory Visit (INDEPENDENT_AMBULATORY_CARE_PROVIDER_SITE_OTHER): Payer: 59

## 2012-01-17 DIAGNOSIS — E349 Endocrine disorder, unspecified: Secondary | ICD-10-CM

## 2012-01-17 DIAGNOSIS — E291 Testicular hypofunction: Secondary | ICD-10-CM

## 2012-01-17 MED ORDER — TESTOSTERONE CYPIONATE 200 MG/ML IM SOLN
200.0000 mg | INTRAMUSCULAR | Status: DC
  Administered 2012-01-17: 200 mg via INTRAMUSCULAR

## 2012-01-17 NOTE — Progress Notes (Signed)
  Subjective:    Patient ID: Scott Sosa, male    DOB: 1961/04/03, 51 y.o.   MRN: [id number]  HPI    Review of Systems     Objective:   Physical Exam        Assessment & Plan:  Pt came in for his testosterone injection. Pt handled injection ok

## 2012-01-31 ENCOUNTER — Ambulatory Visit (INDEPENDENT_AMBULATORY_CARE_PROVIDER_SITE_OTHER): Payer: 59 | Admitting: Family Medicine

## 2012-01-31 DIAGNOSIS — R7989 Other specified abnormal findings of blood chemistry: Secondary | ICD-10-CM

## 2012-01-31 DIAGNOSIS — E291 Testicular hypofunction: Secondary | ICD-10-CM

## 2012-01-31 MED ORDER — TESTOSTERONE CYPIONATE 200 MG/ML IM SOLN
200.0000 mg | INTRAMUSCULAR | Status: DC
  Administered 2012-01-31: 200 mg via INTRAMUSCULAR

## 2012-01-31 MED ORDER — TESTOSTERONE CYPIONATE 200 MG/ML IM SOLN: 200.0000 mg | INTRAMUSCULAR | Status: DC

## 2012-01-31 NOTE — Progress Notes (Signed)
  Subjective:    Patient ID: Scott Sosa, male    DOB: [birth year], 51 y.o.   MRN: [id number]  HPI    Review of Systems     Objective:   Physical Exam        Assessment & Plan:

## 2012-02-01 NOTE — Progress Notes (Signed)
Patient ID: Scott Sosa, male   DOB: 07/15/1961, 51 y.o.   MRN: [id number] Patient in today for nurse visit for testosterone injection

## 2012-02-05 ENCOUNTER — Other Ambulatory Visit: Payer: Self-pay | Admitting: Family Medicine

## 2012-02-05 DIAGNOSIS — M542 Cervicalgia: Secondary | ICD-10-CM

## 2012-02-05 DIAGNOSIS — M545 Low back pain: Secondary | ICD-10-CM

## 2012-02-05 MED ORDER — OXYCODONE-ACETAMINOPHEN 10-325 MG PO TABS: 1.0000 | ORAL_TABLET | Freq: Three times a day (TID) | ORAL | Status: DC | PRN

## 2012-02-05 NOTE — Telephone Encounter (Signed)
Pt informed that RX is ready to be picked up 

## 2012-02-05 NOTE — Telephone Encounter (Signed)
Pt informed that RX is available to be picked up

## 2012-02-05 NOTE — Telephone Encounter (Signed)
Rx refill for oxycodone, call when ready to pickup

## 2012-02-05 NOTE — Telephone Encounter (Signed)
Please advise refill? Last RX wrote on 01-11-12 quantity 90 with 0 refills

## 2012-02-14 ENCOUNTER — Ambulatory Visit (INDEPENDENT_AMBULATORY_CARE_PROVIDER_SITE_OTHER): Payer: 59

## 2012-02-14 DIAGNOSIS — E291 Testicular hypofunction: Secondary | ICD-10-CM

## 2012-02-14 DIAGNOSIS — R7989 Other specified abnormal findings of blood chemistry: Secondary | ICD-10-CM

## 2012-02-14 MED ORDER — TESTOSTERONE CYPIONATE 200 MG/ML IM SOLN
200.0000 mg | INTRAMUSCULAR | Status: DC
  Administered 2012-02-14: 200 mg via INTRAMUSCULAR

## 2012-02-14 NOTE — Progress Notes (Signed)
  Subjective:    Patient ID: Scott Sosa, male    DOB: 07/05/1961, 51 y.o.   MRN: [id number]  HPI    Review of Systems     Objective:   Physical Exam        Assessment & Plan:  Patient came in today for his biweekly testosterone injection. Patient tolerated injection good

## 2012-02-28 ENCOUNTER — Ambulatory Visit: Payer: 59

## 2012-02-29 ENCOUNTER — Ambulatory Visit (INDEPENDENT_AMBULATORY_CARE_PROVIDER_SITE_OTHER): Payer: 59

## 2012-02-29 ENCOUNTER — Other Ambulatory Visit: Payer: Self-pay

## 2012-02-29 DIAGNOSIS — M542 Cervicalgia: Secondary | ICD-10-CM

## 2012-02-29 DIAGNOSIS — M545 Low back pain: Secondary | ICD-10-CM

## 2012-02-29 DIAGNOSIS — R7989 Other specified abnormal findings of blood chemistry: Secondary | ICD-10-CM

## 2012-02-29 DIAGNOSIS — E291 Testicular hypofunction: Secondary | ICD-10-CM

## 2012-02-29 MED ORDER — TESTOSTERONE CYPIONATE 200 MG/ML IM SOLN
200.0000 mg | INTRAMUSCULAR | Status: DC
  Administered 2012-02-29: 200 mg via INTRAMUSCULAR

## 2012-02-29 MED ORDER — OXYCODONE-ACETAMINOPHEN 10-325 MG PO TABS: 1.0000 | ORAL_TABLET | Freq: Three times a day (TID) | ORAL | Status: DC | PRN

## 2012-02-29 MED ORDER — LISINOPRIL-HYDROCHLOROTHIAZIDE 20-25 MG PO TABS: 1.0000 | ORAL_TABLET | Freq: Every day | ORAL | Status: DC

## 2012-02-29 NOTE — Telephone Encounter (Signed)
I will give RX to patient

## 2012-02-29 NOTE — Telephone Encounter (Signed)
Pt is coming in for his testosterone injection this afternoon and would like to know if he can have a refill on his Oxy? The last RX was wrote on 02-05-12 quantity 90 with 0 refills. Please advise

## 2012-02-29 NOTE — Progress Notes (Signed)
  Subjective:    Patient ID: Scott Sosa, male    DOB: March 14, 1961, 51 y.o.   MRN: [id number]  HPI    Review of Systems     Objective:   Physical Exam        Assessment & Plan:  Pt is in for his testosterone injection. Patient handled injection good.

## 2012-03-01 ENCOUNTER — Ambulatory Visit: Payer: 59

## 2012-03-15 ENCOUNTER — Other Ambulatory Visit: Payer: 59

## 2012-03-19 ENCOUNTER — Other Ambulatory Visit: Payer: 59

## 2012-03-22 ENCOUNTER — Ambulatory Visit: Payer: 59 | Admitting: Family Medicine

## 2012-03-29 ENCOUNTER — Ambulatory Visit (INDEPENDENT_AMBULATORY_CARE_PROVIDER_SITE_OTHER): Payer: 59 | Admitting: Family Medicine

## 2012-03-29 ENCOUNTER — Encounter: Payer: Self-pay | Admitting: Family Medicine

## 2012-03-29 VITALS — BP 131/90 | HR 120 | Temp 97.4°F | Ht 67.75 in | Wt 204.8 lb

## 2012-03-29 DIAGNOSIS — M542 Cervicalgia: Secondary | ICD-10-CM

## 2012-03-29 DIAGNOSIS — M79609 Pain in unspecified limb: Secondary | ICD-10-CM

## 2012-03-29 DIAGNOSIS — M79671 Pain in right foot: Secondary | ICD-10-CM | POA: Insufficient documentation

## 2012-03-29 DIAGNOSIS — G8929 Other chronic pain: Secondary | ICD-10-CM

## 2012-03-29 DIAGNOSIS — M545 Low back pain: Secondary | ICD-10-CM

## 2012-03-29 DIAGNOSIS — M25579 Pain in unspecified ankle and joints of unspecified foot: Secondary | ICD-10-CM

## 2012-03-29 DIAGNOSIS — M25572 Pain in left ankle and joints of left foot: Secondary | ICD-10-CM

## 2012-03-29 HISTORY — DX: Other chronic pain: G89.29

## 2012-03-29 MED ORDER — OXYCODONE-ACETAMINOPHEN 10-325 MG PO TABS: 1.0000 | ORAL_TABLET | Freq: Three times a day (TID) | ORAL | Status: DC | PRN

## 2012-03-29 NOTE — Progress Notes (Signed)
OFFICE NOTE  03/29/2012  CC:  Chief Complaint  Patient presents with  . Tendonitis    in left foot- referral     HPI: Patient is a 51 y.o. Caucasian male who is here for "tendonitis".   Describes months of lateral left ankle pain extending across tops of metatarsals. Now having left heel pain "like there is a rock in it"--going on for months now.   These pains have caused flare of pain in low back due to the way he has had to alter his gait.  No swelling or redness in foot or ankle.  Takes oxycodone/APAP 3-5 times per day.   Describes throbbing pain, 8-9/10 intensity.   [facility] foot inserts and a soft ankle support help minimally now.  Pertinent PMH:  Past Medical History  Diagnosis Date  . Allergy     seasonal  . Diabetes mellitus 7-12  . Hypertension   . Chronic lower back pain   . Chicken pox as a teenager  . Arthritis   . Hyperlipidemia   . Low back pain 06/14/2011  . Neck pain, chronic 06/14/2011  . Overweight 06/14/2011  . HTN (hypertension) 06/14/2011  . Tobacco abuse 06/14/2011  . 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 Diabetes mellitus 06/14/2011  . Allergic state 06/14/2011  . Vitamin d deficiency   . Tinea corporis 06/20/2011  . Folliculitis 07/13/2011  . Fatigue 07/13/2011  . Anxiety and depression 08/12/2011  . Sleep apnea 09/25/2011  . Testosterone deficiency 09/25/2011  . Foot pain, left 11/21/2011  . OA (osteoarthritis) 11/30/2011    MEDS:  Outpatient Prescriptions Prior to Visit  Medication Sig Dispense Refill  . amLODipine (NORVASC) 10 MG tablet Take 5 mg by mouth daily.       . Cholecalciferol 1000 UNITS tablet Take 1,000 Units by mouth daily.        Marland Kitchen lisinopril-hydrochlorothiazide (PRINZIDE,ZESTORETIC) 20-25 MG per tablet Take 1 tablet by mouth daily.  90 tablet  1  . loratadine (CLARITIN) 10 MG tablet Take 10 mg by mouth daily.        Marland Kitchen lovastatin (MEVACOR) 20 MG tablet Take 1 tablet (20 mg total) by mouth at bedtime.  90 tablet  0  . metFORMIN  (GLUCOPHAGE) 500 MG tablet Take 500 mg by mouth 2 (two) times daily with a meal.        . metoprolol succinate (TOPROL XL) 25 MG 24 hr tablet Take 1 tablet (25 mg total) by mouth daily.  30 tablet  11  . niacin (SLO-NIACIN) 500 MG tablet Take 1,000 mg by mouth at bedtime.       Marland Kitchen testosterone cypionate (DEPOTESTOTERONE CYPIONATE) 200 MG/ML injection Inject 1 mL (200 mg total) into the muscle every 14 (fourteen) days.  10 mL  0  . oxyCODONE-acetaminophen (PERCOCET) 10-325 MG per tablet Take 1 tablet by mouth every 8 (eight) hours as needed for pain.  90 tablet  0    PE: Blood pressure 131/90, pulse 120, temperature 97.4 F (36.3 C), temperature source Temporal, height 5' 7.75" (1.721 m), weight 204 lb 12.8 oz (92.897 kg), SpO2 98.00%. Gen: Alert, well appearing.  Patient is oriented to person, place, time, and situation. Left ankle: ROM intact but some pain with plantar and dorsiflexion.  Mild TTP in distal gastroc region.   Achilles tendon itself is nontender.  Nontender at posterior calcaneus region.  Central plantar surface of calcaneus is very tender to palpation.  Mild TTP over lateral portion of ankle/foot below the level of the  lateral malleolus, extending across top of foot over metatarsal head region.  No erythema, swelling, or edema of foot or ankle or calf.    IMPRESSION AND PLAN:  Chronic pain in left foot Patient has reached his limit of tolerance for the pain and current management approach.  He requested referral to a foot specialist.  I made referral today to [facility] in [place]. I also filled his Percocet 10/325 rx today, #90, no RF--this was about 5d early due to extra use lately with worsening of his chronic pain in foot and the reactive back pain this has caused.  Note given to excuse him from work yesterday.   FOLLOW UP: prn

## 2012-03-29 NOTE — Assessment & Plan Note (Signed)
Patient has reached his limit of tolerance for the pain and current management approach.  He requested referral to a foot specialist.  I made referral today to [facility] in [place]. I also filled his Percocet 10/325 rx today, #90, no RF--this was about 5d early due to extra use lately with worsening of his chronic pain in foot and the reactive back pain this has caused.  Note given to excuse him from work yesterday.

## 2012-04-02 ENCOUNTER — Other Ambulatory Visit: Payer: 59

## 2012-04-03 ENCOUNTER — Telehealth: Payer: Self-pay | Admitting: Family Medicine

## 2012-04-03 ENCOUNTER — Other Ambulatory Visit: Payer: Self-pay | Admitting: Family Medicine

## 2012-04-03 ENCOUNTER — Other Ambulatory Visit: Payer: 59

## 2012-04-03 DIAGNOSIS — I10 Essential (primary) hypertension: Secondary | ICD-10-CM

## 2012-04-03 DIAGNOSIS — E291 Testicular hypofunction: Secondary | ICD-10-CM

## 2012-04-03 NOTE — Telephone Encounter (Signed)
Patient needs work excuse for 04/01/12 for foot pain. He will pick up 04/04/12 afternoon.

## 2012-04-03 NOTE — Telephone Encounter (Signed)
Please write him a note for work excusing him on 8/19 since he saw Dr Milinda Cave for foot pain on 8/16

## 2012-04-04 ENCOUNTER — Ambulatory Visit (INDEPENDENT_AMBULATORY_CARE_PROVIDER_SITE_OTHER): Payer: 59

## 2012-04-04 DIAGNOSIS — E291 Testicular hypofunction: Secondary | ICD-10-CM

## 2012-04-04 DIAGNOSIS — E349 Endocrine disorder, unspecified: Secondary | ICD-10-CM

## 2012-04-04 LAB — HEPATIC FUNCTION PANEL
ALT: 19 U/L (ref 0–53)
Bilirubin, Direct: 0.1 mg/dL (ref 0.0–0.3)
Total Protein: 7.4 g/dL (ref 6.0–8.3)

## 2012-04-04 LAB — TSH: TSH: 0.621 u[IU]/mL (ref 0.350–4.500)

## 2012-04-04 LAB — RENAL FUNCTION PANEL
Albumin: 4.5 g/dL (ref 3.5–5.2)
BUN: 17 mg/dL (ref 6–23)
Calcium: 9.6 mg/dL (ref 8.4–10.5)
Glucose, Bld: 147 mg/dL — ABNORMAL HIGH (ref 70–99)
Sodium: 133 mEq/L — ABNORMAL LOW (ref 135–145)

## 2012-04-04 LAB — CBC
HCT: 45.5 % (ref 39.0–52.0)
MCV: 88.9 fL (ref 78.0–100.0)
Platelets: 321 10*3/uL (ref 150–400)
RBC: 5.12 MIL/uL (ref 4.22–5.81)
WBC: 11 10*3/uL — ABNORMAL HIGH (ref 4.0–10.5)

## 2012-04-04 LAB — TESTOSTERONE: Testosterone: 96.73 ng/dL — ABNORMAL LOW (ref 300–890)

## 2012-04-04 LAB — LIPID PANEL
HDL: 42 mg/dL (ref >39)
LDL Cholesterol: 137 mg/dL — ABNORMAL HIGH (ref 0–99)
Total CHOL/HDL Ratio: 5.6 Ratio

## 2012-04-04 MED ORDER — TESTOSTERONE CYPIONATE 200 MG/ML IM SOLN
200.0000 mg | INTRAMUSCULAR | Status: DC
  Administered 2012-04-04: 200 mg via INTRAMUSCULAR

## 2012-04-04 NOTE — Progress Notes (Signed)
Quick Note:  Will give a copy of labs to patient when he comes in for his testosterone injection and inform him of these instructions ______

## 2012-04-04 NOTE — Progress Notes (Signed)
  Subjective:    Patient ID: Scott Sosa, male    DOB: [birth year], 51 y.o.   MRN: [id number]  HPI    Review of Systems     Objective:   Physical Exam        Assessment & Plan:  Patient came in today for his testosterone injection. Patient handled injection well

## 2012-04-04 NOTE — Telephone Encounter (Signed)
Printed letter and left a detailed message that letter is done and at front desk

## 2012-04-08 ENCOUNTER — Ambulatory Visit: Payer: 59 | Admitting: Family Medicine

## 2012-04-12 ENCOUNTER — Ambulatory Visit: Payer: 59 | Admitting: Family Medicine

## 2012-04-12 DIAGNOSIS — Z0289 Encounter for other administrative examinations: Secondary | ICD-10-CM

## 2012-04-18 ENCOUNTER — Ambulatory Visit: Payer: 59

## 2012-04-18 ENCOUNTER — Encounter: Payer: Self-pay | Admitting: Family Medicine

## 2012-04-18 ENCOUNTER — Ambulatory Visit (INDEPENDENT_AMBULATORY_CARE_PROVIDER_SITE_OTHER): Payer: 59 | Admitting: Family Medicine

## 2012-04-18 VITALS — BP 146/98 | HR 102 | Temp 97.0°F | Ht 67.75 in | Wt 213.0 lb

## 2012-04-18 DIAGNOSIS — G8929 Other chronic pain: Secondary | ICD-10-CM

## 2012-04-18 DIAGNOSIS — M79673 Pain in unspecified foot: Secondary | ICD-10-CM | POA: Insufficient documentation

## 2012-04-18 DIAGNOSIS — M545 Low back pain: Secondary | ICD-10-CM

## 2012-04-18 DIAGNOSIS — M542 Cervicalgia: Secondary | ICD-10-CM

## 2012-04-18 DIAGNOSIS — M79609 Pain in unspecified limb: Secondary | ICD-10-CM

## 2012-04-18 DIAGNOSIS — M79672 Pain in left foot: Secondary | ICD-10-CM

## 2012-04-18 MED ORDER — OXYCODONE-ACETAMINOPHEN 10-325 MG PO TABS: 1.0000 | ORAL_TABLET | Freq: Three times a day (TID) | ORAL | Status: DC | PRN

## 2012-04-18 NOTE — Progress Notes (Signed)
OFFICE NOTE  04/18/2012  CC:  Chief Complaint  Patient presents with  . Pain    heel pain; has seen podiatry     HPI: Patient is a 51 y.o. Caucasian male who is here for ongoing left heel pain, going on for months now, saw podiatrist 2d/a and x-ray showed small heel spurs on both feet, dx'd with plantar fasciitis with some secondary metatarsal head pain diffusely as a result of altered gait.  He was given a steroid injection for plantar fasciitis per his report today, feels no improvement.  Says pain is very severe, particularly when standing up --he can work 7-8 hours at a time on his feet before it becomes too excruciating to stand.   He has been compliant with his bp med---took it about 3 hours ago today.  Pertinent PMH:  Past Medical History  Diagnosis Date  . Allergy     seasonal  . Diabetes mellitus 7-12  . Hypertension   . Chronic lower back pain   . Chicken pox as a teenager  . Arthritis   . Hyperlipidemia   . Low back pain 06/14/2011  . Neck pain, chronic 06/14/2011  . Overweight 06/14/2011  . HTN (hypertension) 06/14/2011  . Tobacco abuse 06/14/2011  . 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 Diabetes mellitus 06/14/2011  . Allergic state 06/14/2011  . Vitamin d deficiency   . Tinea corporis 06/20/2011  . Folliculitis 07/13/2011  . Fatigue 07/13/2011  . Anxiety and depression 08/12/2011  . Sleep apnea 09/25/2011  . Testosterone deficiency 09/25/2011  . Foot pain, left 11/21/2011  . OA (osteoarthritis) 11/30/2011    MEDS:  Outpatient Prescriptions Prior to Visit  Medication Sig Dispense Refill  . amLODipine (NORVASC) 10 MG tablet Take 5 mg by mouth daily.       . Cholecalciferol 1000 UNITS tablet Take 1,000 Units by mouth daily.        . cyclobenzaprine (FLEXERIL) 10 MG tablet       . lisinopril-hydrochlorothiazide (PRINZIDE,ZESTORETIC) 20-25 MG per tablet Take 1 tablet by mouth daily.  90 tablet  1  . loratadine (CLARITIN) 10 MG tablet Take 10 mg by mouth daily.        Marland Kitchen  lovastatin (MEVACOR) 20 MG tablet Take 1 tablet (20 mg total) by mouth at bedtime.  90 tablet  0  . metFORMIN (GLUCOPHAGE) 500 MG tablet Take 500 mg by mouth 2 (two) times daily with a meal.        . metoprolol succinate (TOPROL XL) 25 MG 24 hr tablet Take 1 tablet (25 mg total) by mouth daily.  30 tablet  11  . oxyCODONE-acetaminophen (PERCOCET) 10-325 MG per tablet Take 1 tablet by mouth every 8 (eight) hours as needed for pain.  90 tablet  0  . testosterone cypionate (DEPOTESTOTERONE CYPIONATE) 200 MG/ML injection Inject 1 mL (200 mg total) into the muscle every 14 (fourteen) days.  10 mL  0  . niacin (SLO-NIACIN) 500 MG tablet Take 1,000 mg by mouth at bedtime.         PE: Blood pressure 171/104, pulse 102, temperature 97 F (36.1 C), temperature source Temporal, height 5' 7.75" (1.721 m), weight 213 lb (96.616 kg).  BP recheck at end of visit was 146/98. Gen: Alert, well appearing.  Patient is oriented to person, place, time, and situation.   Feet without swelling or deformity.   Skin is pink. Left heel with focal tenderness directly over the center of calcaneus.  No tenderness beyond this into  the region of the plantar fascia.    IMPRESSION AND PLAN:  Heel pain Left:  Question focal calcaneal spur pain vs atypical plantar fasciitis. Discussed giving the injection time to help, also added post-op/hard soled shoe for left foot, add topical compounded anti-inflammitory 2g qid prn, and I did go ahead and RF his pain med early today.  He'll f/u with Dr. Abner Greenspan in a month or less. He has podiatrist f/u 05/09/12.    FOLLOW UP: 57mo or less

## 2012-04-18 NOTE — Assessment & Plan Note (Addendum)
Left:  Question focal calcaneal spur pain vs atypical plantar fasciitis. Discussed giving the injection time to help, also added post-op/hard soled shoe for left foot, add topical compounded anti-inflammitory 2g qid prn, and I did go ahead and RF his pain med early today.  He'll f/u with Dr. Abner Greenspan in a month or less. He has podiatrist f/u 05/09/12.

## 2012-04-26 ENCOUNTER — Ambulatory Visit: Payer: 59 | Admitting: Family Medicine

## 2012-05-01 ENCOUNTER — Ambulatory Visit: Payer: 59 | Admitting: Family Medicine

## 2012-05-01 DIAGNOSIS — Z0289 Encounter for other administrative examinations: Secondary | ICD-10-CM

## 2012-05-14 ENCOUNTER — Encounter: Payer: Self-pay | Admitting: Family Medicine

## 2012-05-14 ENCOUNTER — Ambulatory Visit (INDEPENDENT_AMBULATORY_CARE_PROVIDER_SITE_OTHER): Payer: 59 | Admitting: Family Medicine

## 2012-05-14 VITALS — BP 142/94 | HR 103 | Temp 97.4°F | Ht 67.75 in | Wt 205.8 lb

## 2012-05-14 DIAGNOSIS — E349 Endocrine disorder, unspecified: Secondary | ICD-10-CM

## 2012-05-14 DIAGNOSIS — G8929 Other chronic pain: Secondary | ICD-10-CM

## 2012-05-14 DIAGNOSIS — Z720 Tobacco use: Secondary | ICD-10-CM

## 2012-05-14 DIAGNOSIS — R000 Tachycardia, unspecified: Secondary | ICD-10-CM

## 2012-05-14 DIAGNOSIS — M545 Low back pain, unspecified: Secondary | ICD-10-CM

## 2012-05-14 DIAGNOSIS — E291 Testicular hypofunction: Secondary | ICD-10-CM

## 2012-05-14 DIAGNOSIS — M722 Plantar fascial fibromatosis: Secondary | ICD-10-CM

## 2012-05-14 DIAGNOSIS — I10 Essential (primary) hypertension: Secondary | ICD-10-CM

## 2012-05-14 DIAGNOSIS — Z23 Encounter for immunization: Secondary | ICD-10-CM

## 2012-05-14 DIAGNOSIS — E119 Type 2 diabetes mellitus without complications: Secondary | ICD-10-CM

## 2012-05-14 DIAGNOSIS — M79609 Pain in unspecified limb: Secondary | ICD-10-CM

## 2012-05-14 DIAGNOSIS — F17200 Nicotine dependence, unspecified, uncomplicated: Secondary | ICD-10-CM

## 2012-05-14 DIAGNOSIS — M542 Cervicalgia: Secondary | ICD-10-CM

## 2012-05-14 DIAGNOSIS — R7989 Other specified abnormal findings of blood chemistry: Secondary | ICD-10-CM

## 2012-05-14 MED ORDER — METOPROLOL SUCCINATE ER 50 MG PO TB24: 50.0000 mg | ORAL_TABLET | Freq: Every day | ORAL | Status: DC

## 2012-05-14 MED ORDER — OXYCODONE-ACETAMINOPHEN 10-325 MG PO TABS: 1.0000 | ORAL_TABLET | Freq: Three times a day (TID) | ORAL | Status: DC | PRN

## 2012-05-14 MED ORDER — DICLOFENAC SODIUM POWD: Status: DC

## 2012-05-14 MED ORDER — TESTOSTERONE CYPIONATE 200 MG/ML IM SOLN: 300.0000 mg | INTRAMUSCULAR | Status: DC

## 2012-05-14 NOTE — Assessment & Plan Note (Signed)
Increase Metoprolol XL 50 mg daily and recheck at next visit and call if any concerns

## 2012-05-14 NOTE — Assessment & Plan Note (Signed)
Increased his testosterone to 300 mg every 2 weeks. Due to his declining numbers.

## 2012-05-14 NOTE — Assessment & Plan Note (Signed)
Unfortunately continues to smoke encouraged to decrease consumption

## 2012-05-14 NOTE — Assessment & Plan Note (Signed)
hgba1c good, patient reports good control. Recheck hgba1c

## 2012-05-14 NOTE — Assessment & Plan Note (Signed)
Did have a steroid injection which he did not feel was helpful but then started a topical cream with Diclofenac, Bupivocaine etc in it and has had complete relief

## 2012-05-14 NOTE — Patient Instructions (Signed)
Nonspecific Tachycardia  Tachycardia is a faster than normal heartbeat (more than 100 beats per minute). In adults, the heart normally beats between 60 and 100 times a minute. A fast heartbeat may be a normal response to exercise or stress. It does not necessarily mean that something is wrong. However, sometimes when your heart beats too fast it may not be able to pump enough blood to the rest of your body. This can result in chest pain, shortness of breath, dizziness, and even fainting. Nonspecific tachycardia means that the specific cause or pattern of your tachycardia is unknown.  CAUSES   Tachycardia may be harmless or it may be due to a more serious underlying cause. Possible causes of tachycardia include:   Exercise or exertion.   Fever.   Pain or injury.   Infection.   Loss of body fluids (dehydration).   Overactive thyroid.   Lack of red blood cells (anemia).   Anxiety and stress.   Alcohol.   Caffeine.   Tobacco products.   Diet pills.   Illegal drugs.   Heart disease.  SYMPTOMS   Rapid or irregular heartbeat (palpitations).   Suddenly feeling your heart beating (cardiac awareness).   Dizziness.   Tiredness (fatigue).   Shortness of breath.   Chest pain.   Nausea.   Fainting.  DIAGNOSIS   Your caregiver will perform a physical exam and take your medical history. In some cases, a heart specialist (cardiologist) may be consulted. Your caregiver may also order:   Blood tests.   Electrocardiography. This test records the electrical activity of your heart.   A heart monitoring test.  TREATMENT   Treatment will depend on the likely cause of your tachycardia. The goal is to treat the underlying cause of your tachycardia. Treatment methods may include:   Replacement of fluids or blood through an intravenous (IV) tube for moderate to severe dehydration or anemia.   New medicines or changes in your current medicines.   Diet and lifestyle changes.   Treatment for certain  infections.   Stress relief or relaxation methods.  HOME CARE INSTRUCTIONS    Rest.   Drink enough fluids to keep your urine clear or pale yellow.   Do not smoke.   Avoid:   Caffeine.   Tobacco.   Alcohol.   Chocolate.   Stimulants such as over-the-counter diet pills or pills that help you stay awake.   Situations that cause anxiety or stress.   Illegal drugs such as marijuana, phencyclidine (PCP), and cocaine.   Only take medicine as directed by your caregiver.   Keep all follow-up appointments as directed by your caregiver.  SEEK IMMEDIATE MEDICAL CARE IF:    You have pain in your chest, upper arms, jaw, or neck.   You become weak, dizzy, or feel faint.   You have palpitations that will not go away.   You vomit, have diarrhea, or pass blood in your stool.   Your skin is cool, pale, and wet.   You have a fever that will not go away with rest, fluids, and medicine.  MAKE SURE YOU:    Understand these instructions.   Will watch your condition.   Will get help right away if you are not doing well or get worse.  Document Released: 09/07/2004 Document Revised: 10/23/2011 Document Reviewed: 07/11/2011  [employer] Patient Information 2013 [employer]

## 2012-05-14 NOTE — Progress Notes (Signed)
Patient ID: Scott Sosa, male   DOB: [birth year], 51 y.o.   MRN: [id number] Scott Sosa [id number] 01/15/1961 05/14/2012      Progress Note-Follow Up  Subjective  Chief Complaint  Chief Complaint  Patient presents with  . Follow-up  . Injections    flu    HPI  Patient is a 51 year old Caucasian male who is in today for followup. He is very pleased with the improvement in left heel pain. He notes steroid injections did not help her to sleep but his topical treatment has been very helpful and he says he feels essentially pain-free. Unfortunately he's also been laid off work for the next month and so the rest is helping as well. He's not had any testosterone injections in about 3 weeks secondary to running out of medication but does not feel that as far as if the testosterone treatment have been helpful for his energy level etc. Otherwise he offers no acute complaints. No recent headache, chest pain, palpitations, illness, GI or GU complaints are noted in  Past Medical History  Diagnosis Date  . Allergy     seasonal  . Diabetes mellitus 7-12  . Hypertension   . Chronic lower back pain   . Chicken pox as a teenager  . Arthritis   . Hyperlipidemia   . Low back pain 06/14/2011  . Neck pain, chronic 06/14/2011  . Overweight 06/14/2011  . HTN (hypertension) 06/14/2011  . Tobacco abuse 06/14/2011  . 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 Diabetes mellitus 06/14/2011  . Allergic state 06/14/2011  . Vitamin d deficiency   . Tinea corporis 06/20/2011  . Folliculitis 07/13/2011  . Fatigue 07/13/2011  . Anxiety and depression 08/12/2011  . Sleep apnea 09/25/2011  . Testosterone deficiency 09/25/2011  . Foot pain, left 11/21/2011  . OA (osteoarthritis) 11/30/2011    No past surgical history on file.  Family History  Problem Relation Age of Onset  . Hyperlipidemia Mother   . Diabetes Mother     type 2  . Thyroid disease Mother   . Arthritis Mother     crippling  . Hyperlipidemia Father     . Cancer Father     gum/ smoked cigars  . Arthritis Sister     rheumatoid  . Other Sister     heart arrythmia  . Heart disease Sister   . Other Sister     bad back  . Arthritis Sister     chronic    History   Social History  . Marital Status: Married    Spouse Name: N/A    Number of Children: N/A  . Years of Education: N/A   Occupational History  . Not on file.   Social History Main Topics  . Smoking status: Current Every Day Smoker -- 0.5 packs/day for 30 years    Types: Cigarettes  . Smokeless tobacco: Never Used  . Alcohol Use: Yes     12 pack of beer a week  . Drug Use: No  . Sexually Active: Yes -- Male partner(s)   Other Topics Concern  . Not on file   Social History Narrative  . No narrative on file    Current Outpatient Prescriptions on File Prior to Visit  Medication Sig Dispense Refill  . amLODipine (NORVASC) 10 MG tablet Take 5 mg by mouth daily.       . Cholecalciferol 1000 UNITS tablet Take 1,000 Units by mouth daily.        Marland Kitchen lisinopril-hydrochlorothiazide (PRINZIDE,ZESTORETIC)  20-25 MG per tablet Take 1 tablet by mouth daily.  90 tablet  1  . loratadine (CLARITIN) 10 MG tablet Take 10 mg by mouth daily.        Marland Kitchen lovastatin (MEVACOR) 20 MG tablet Take 1 tablet (20 mg total) by mouth at bedtime.  90 tablet  0  . metFORMIN (GLUCOPHAGE) 500 MG tablet Take 500 mg by mouth 2 (two) times daily with a meal.        . DISCONTD: testosterone cypionate (DEPOTESTOTERONE CYPIONATE) 200 MG/ML injection Inject 1 mL (200 mg total) into the muscle every 14 (fourteen) days.  10 mL  0  . cyclobenzaprine (FLEXERIL) 10 MG tablet         Allergies  Allergen Reactions  . Wellbutrin (Bupropion Hcl)     irritable    Review of Systems  Review of Systems  Constitutional: Positive for malaise/fatigue. Negative for fever.  HENT: Negative for congestion.   Eyes: Negative for discharge.  Respiratory: Negative for shortness of breath.   Cardiovascular: Negative for  chest pain, palpitations and leg swelling.  Gastrointestinal: Negative for nausea, abdominal pain and diarrhea.  Genitourinary: Negative for dysuria.  Musculoskeletal: Positive for back pain. Negative for falls.  Skin: Negative for rash.  Neurological: Negative for loss of consciousness and headaches.  Endo/Heme/Allergies: Negative for polydipsia.  Psychiatric/Behavioral: Negative for depression and suicidal ideas. The patient is not nervous/anxious and does not have insomnia.     Objective  BP 142/94  Pulse 103  Temp 97.4 F (36.3 C) (Temporal)  Ht 5' 7.75" (1.721 m)  Wt 205 lb 12.8 oz (93.35 kg)  BMI 31.52 kg/m2  SpO2 96%  Physical Exam  Physical Exam  Constitutional: He is oriented to person, place, and time and well-developed, well-nourished, and in no distress. No distress.  HENT:  Head: Normocephalic and atraumatic.  Eyes: Conjunctivae normal are normal.  Neck: Neck supple. No thyromegaly present.  Cardiovascular: Normal rate, regular rhythm and normal heart sounds.   No murmur heard. Pulmonary/Chest: Effort normal and breath sounds normal. No respiratory distress.  Abdominal: He exhibits no distension and no mass. There is no tenderness.  Musculoskeletal: He exhibits no edema.  Neurological: He is alert and oriented to person, place, and time.  Skin: Skin is warm.  Psychiatric: Memory, affect and judgment normal.    Lab Results  Component Value Date   TSH 0.621 04/03/2012   Lab Results  Component Value Date   WBC 11.0* 04/03/2012   HGB 15.7 04/03/2012   HCT 45.5 04/03/2012   MCV 88.9 04/03/2012   PLT 321 04/03/2012   Lab Results  Component Value Date   CREATININE 0.82 04/03/2012   BUN 17 04/03/2012   NA 133* 04/03/2012   K 4.1 04/03/2012   CL 100 04/03/2012   CO2 24 04/03/2012   Lab Results  Component Value Date   ALT 19 04/03/2012   AST 18 04/03/2012   ALKPHOS 50 04/03/2012   BILITOT 0.4 04/03/2012   Lab Results  Component Value Date   CHOL 235* 04/03/2012     Lab Results  Component Value Date   HDL 42 04/03/2012   Lab Results  Component Value Date   LDLCALC 137* 04/03/2012   Lab Results  Component Value Date   TRIG 278* 04/03/2012   Lab Results  Component Value Date   CHOLHDL 5.6 04/03/2012     Assessment & Plan  HTN (hypertension) Increase Metoprolol XL 50 mg daily and recheck at next visit and  call if any concerns  Diabetes mellitus hgba1c good, patient reports good control. Recheck hgba1c   Chronic pain in left foot Did have a steroid injection which he did not feel was helpful but then started a topical cream with Diclofenac, Bupivocaine etc in it and has had complete relief  Testosterone deficiency Increased his testosterone to 300 mg every 2 weeks. Due to his declining numbers.  Tobacco abuse Unfortunately continues to smoke encouraged to decrease consumption

## 2012-05-22 ENCOUNTER — Ambulatory Visit: Payer: 59

## 2012-05-24 ENCOUNTER — Ambulatory Visit: Payer: 59

## 2012-06-07 ENCOUNTER — Telehealth: Payer: Self-pay | Admitting: Family Medicine

## 2012-06-07 NOTE — Telephone Encounter (Signed)
Please advise refill? Last RX was wrote on 05-14-12 quantity 90 with 0 refills and theres a note saying early refill authorized by Dr Milinda Cave?

## 2012-06-07 NOTE — Telephone Encounter (Signed)
Pt informed and states he will call back Mon or Tues

## 2012-06-07 NOTE — Telephone Encounter (Signed)
He can have the Oxycodone with same sig, same number but not til early next week. It is not due til 11/1

## 2012-06-11 ENCOUNTER — Other Ambulatory Visit: Payer: Self-pay

## 2012-06-11 DIAGNOSIS — G8929 Other chronic pain: Secondary | ICD-10-CM

## 2012-06-11 DIAGNOSIS — M545 Low back pain: Secondary | ICD-10-CM

## 2012-06-11 MED ORDER — OXYCODONE-ACETAMINOPHEN 10-325 MG PO TABS: 1.0000 | ORAL_TABLET | Freq: Three times a day (TID) | ORAL | Status: DC | PRN

## 2012-06-11 NOTE — Telephone Encounter (Signed)
Patient called in for his Oxy refill. Ok per md per 06-07-12 telephone encounter.

## 2012-07-08 ENCOUNTER — Other Ambulatory Visit: Payer: 59

## 2012-07-09 ENCOUNTER — Other Ambulatory Visit: Payer: 59

## 2012-07-09 ENCOUNTER — Other Ambulatory Visit: Payer: Self-pay | Admitting: Family Medicine

## 2012-07-09 DIAGNOSIS — M542 Cervicalgia: Secondary | ICD-10-CM

## 2012-07-09 DIAGNOSIS — M545 Low back pain: Secondary | ICD-10-CM

## 2012-07-09 MED ORDER — OXYCODONE-ACETAMINOPHEN 10-325 MG PO TABS: 1.0000 | ORAL_TABLET | Freq: Three times a day (TID) | ORAL | Status: DC | PRN

## 2012-07-09 NOTE — Telephone Encounter (Signed)
RX at front desk. Patient informed 

## 2012-07-09 NOTE — Telephone Encounter (Signed)
Please advise Oxy refill? Last RX was wrote on 06-11-12 quantity 90 with 0 refills

## 2012-07-15 ENCOUNTER — Ambulatory Visit: Payer: 59 | Admitting: Family Medicine

## 2012-07-17 ENCOUNTER — Ambulatory Visit: Payer: 59 | Admitting: Family Medicine

## 2012-08-05 ENCOUNTER — Other Ambulatory Visit: Payer: Self-pay

## 2012-08-05 DIAGNOSIS — M545 Low back pain: Secondary | ICD-10-CM

## 2012-08-05 DIAGNOSIS — G8929 Other chronic pain: Secondary | ICD-10-CM

## 2012-08-05 MED ORDER — OXYCODONE-ACETAMINOPHEN 10-325 MG PO TABS: 1.0000 | ORAL_TABLET | Freq: Three times a day (TID) | ORAL | Status: DC | PRN

## 2012-08-05 NOTE — Telephone Encounter (Signed)
Please advise Oxy refill. Last RX was wrote on 07-09-12 quantity 90 with 0 refills.  Pt aware that MD is out of the office until tomorrow

## 2012-08-05 NOTE — Telephone Encounter (Signed)
Pt informed that he can pick up RX in the morning.

## 2012-08-16 ENCOUNTER — Ambulatory Visit: Payer: 59 | Admitting: Family Medicine

## 2012-09-02 ENCOUNTER — Telehealth: Payer: Self-pay | Admitting: Family Medicine

## 2012-09-02 DIAGNOSIS — M542 Cervicalgia: Secondary | ICD-10-CM

## 2012-09-02 DIAGNOSIS — M545 Low back pain: Secondary | ICD-10-CM

## 2012-09-02 MED ORDER — OXYCODONE-ACETAMINOPHEN 10-325 MG PO TABS: 1.0000 | ORAL_TABLET | Freq: Three times a day (TID) | ORAL | Status: DC | PRN

## 2012-09-02 NOTE — Telephone Encounter (Signed)
OK to refill the Oxycodone on 09/03/12 with same strength, same sig, disp #90 but warn him no further refills til seen

## 2012-09-02 NOTE — Telephone Encounter (Signed)
Patient is requesting a refill of oxycodone and states that he will pickup the written Rx at the [facility] location. I told him that someone will call him to let him know Rx is ready.

## 2012-09-02 NOTE — Telephone Encounter (Signed)
RX done and pt informed and voiced understanding

## 2012-09-02 NOTE — Telephone Encounter (Signed)
Please advise Oxy refill? Last RX was wrote on 08-05-12 quantity 90 with 0 refills. But pt has had 2 cancellations and 1 no show in the last month

## 2012-09-04 ENCOUNTER — Other Ambulatory Visit: Payer: Self-pay | Admitting: Family Medicine

## 2012-10-17 ENCOUNTER — Ambulatory Visit (INDEPENDENT_AMBULATORY_CARE_PROVIDER_SITE_OTHER): Payer: 59 | Admitting: Family Medicine

## 2012-10-17 ENCOUNTER — Encounter: Payer: Self-pay | Admitting: Family Medicine

## 2012-10-17 VITALS — BP 124/88 | HR 146 | Temp 98.0°F | Ht 67.75 in | Wt 203.0 lb

## 2012-10-17 DIAGNOSIS — G4730 Sleep apnea, unspecified: Secondary | ICD-10-CM

## 2012-10-17 DIAGNOSIS — R000 Tachycardia, unspecified: Secondary | ICD-10-CM

## 2012-10-17 DIAGNOSIS — I10 Essential (primary) hypertension: Secondary | ICD-10-CM

## 2012-10-17 DIAGNOSIS — E119 Type 2 diabetes mellitus without complications: Secondary | ICD-10-CM

## 2012-10-17 DIAGNOSIS — F17200 Nicotine dependence, unspecified, uncomplicated: Secondary | ICD-10-CM

## 2012-10-17 DIAGNOSIS — Z720 Tobacco use: Secondary | ICD-10-CM

## 2012-10-17 DIAGNOSIS — M542 Cervicalgia: Secondary | ICD-10-CM

## 2012-10-17 DIAGNOSIS — G8929 Other chronic pain: Secondary | ICD-10-CM

## 2012-10-17 DIAGNOSIS — M545 Low back pain: Secondary | ICD-10-CM

## 2012-10-17 DIAGNOSIS — M1990 Unspecified osteoarthritis, unspecified site: Secondary | ICD-10-CM

## 2012-10-17 MED ORDER — METOPROLOL SUCCINATE ER 50 MG PO TB24: 50.0000 mg | ORAL_TABLET | Freq: Every day | ORAL | Status: DC

## 2012-10-17 MED ORDER — OXYCODONE-ACETAMINOPHEN 10-325 MG PO TABS: 1.0000 | ORAL_TABLET | Freq: Three times a day (TID) | ORAL | Status: DC | PRN

## 2012-10-17 NOTE — Patient Instructions (Addendum)
Rel of rec Labs from [place] in [place]   Hypertension As your heart beats, it forces blood through your arteries. This force is your blood pressure. If the pressure is too high, it is called hypertension (HTN) or high blood pressure. HTN is dangerous because you may have it and not know it. High blood pressure may mean that your heart has to work harder to pump blood. Your arteries may be narrow or stiff. The extra work puts you at risk for heart disease, stroke, and other problems.  Blood pressure consists of two numbers, a higher number over a lower, 110/72, for example. It is stated as "110 over 72." The ideal is below 120 for the top number (systolic) and under 80 for the bottom (diastolic). Write down your blood pressure today. You should pay close attention to your blood pressure if you have certain conditions such as:  Heart failure.  Prior heart attack.  Diabetes  Chronic kidney disease.  Prior stroke.  Multiple risk factors for heart disease. To see if you have HTN, your blood pressure should be measured while you are seated with your arm held at the level of the heart. It should be measured at least twice. A one-time elevated blood pressure reading (especially in the Emergency Department) does not mean that you need treatment. There may be conditions in which the blood pressure is different between your right and left arms. It is important to see your caregiver soon for a recheck. Most people have essential hypertension which means that there is not a specific cause. This type of high blood pressure may be lowered by changing lifestyle factors such as:  Stress.  Smoking.  Lack of exercise.  Excessive weight.  Drug/tobacco/alcohol use.  Eating less salt. Most people do not have symptoms from high blood pressure until it has caused damage to the body. Effective treatment can often prevent, delay or reduce that damage. TREATMENT  When a cause has been identified, treatment  for high blood pressure is directed at the cause. There are a large number of medications to treat HTN. These fall into several categories, and your caregiver will help you select the medicines that are best for you. Medications may have side effects. You should review side effects with your caregiver. If your blood pressure stays high after you have made lifestyle changes or started on medicines,   Your medication(s) may need to be changed.  Other problems may need to be addressed.  Be certain you understand your prescriptions, and know how and when to take your medicine.  Be sure to follow up with your caregiver within the time frame advised (usually within two weeks) to have your blood pressure rechecked and to review your medications.  If you are taking more than one medicine to lower your blood pressure, make sure you know how and at what times they should be taken. Taking two medicines at the same time can result in blood pressure that is too low. SEEK IMMEDIATE MEDICAL CARE IF:  You develop a severe headache, blurred or changing vision, or confusion.  You have unusual weakness or numbness, or a faint feeling.  You have severe chest or abdominal pain, vomiting, or breathing problems. MAKE SURE YOU:   Understand these instructions.  Will watch your condition.  Will get help right away if you are not doing well or get worse. Document Released: 07/31/2005 Document Revised: 10/23/2011 Document Reviewed: 03/20/2008 [facility] Patient Information 2013 [place], [place].

## 2012-10-22 NOTE — Progress Notes (Signed)
Patient ID: Scott Sosa, male   DOB: 13-Sep-1960, 52 y.o.   MRN: [id number] Scott Sosa [id number] 08-31-1960 10/22/2012      Progress Note-Follow Up  Subjective  Chief Complaint  Chief Complaint  Patient presents with  . Follow-up    discuss pain management    HPI  Patient is a 52 year old Caucasian male who is in today for followup. He's been under a great deal of stress since he's been out of work since October but offers no other acute complaints today. Has ongoing chronic knee pain and back pain. [facility] then in [facility] of trying to set him up for an MRI as well as a repeat sleep study and colonoscopy. No fevers or chills. No headaches. No palpitations shortness of breath GI or GU concerns noted today.  Past Medical History  Diagnosis Date  . Allergy     seasonal  . Diabetes mellitus 7-12  . Hypertension   . Chronic lower back pain   . Chicken pox as a teenager  . Arthritis   . Hyperlipidemia   . Low back pain 06/14/2011  . Neck pain, chronic 06/14/2011  . Overweight 06/14/2011  . HTN (hypertension) 06/14/2011  . Tobacco abuse 06/14/2011  . 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 Diabetes mellitus 06/14/2011  . Allergic state 06/14/2011  . Vitamin D deficiency   . Tinea corporis 06/20/2011  . Folliculitis 07/13/2011  . Fatigue 07/13/2011  . Anxiety and depression 08/12/2011  . Sleep apnea 09/25/2011  . Testosterone deficiency 09/25/2011  . Foot pain, left 11/21/2011  . OA (osteoarthritis) 11/30/2011    History reviewed. No pertinent past surgical history.  Family History  Problem Relation Age of Onset  . Hyperlipidemia Mother   . Diabetes Mother     type 2  . Thyroid disease Mother   . Arthritis Mother     crippling  . Hyperlipidemia Father   . Cancer Father     gum/ smoked cigars  . Arthritis Sister     rheumatoid  . Other Sister     heart arrythmia  . Heart disease Sister   . Other Sister     bad back  . Arthritis Sister    chronic    History   Social History  . Marital Status: Married    Spouse Name: N/A    Number of Children: N/A  . Years of Education: N/A   Occupational History  . Not on file.   Social History Main Topics  . Smoking status: Current Every Day Smoker -- 0.50 packs/day for 30 years    Types: Cigarettes  . Smokeless tobacco: Never Used  . Alcohol Use: Yes     Comment: 12 pack of beer a week  . Drug Use: No  . Sexually Active: Yes -- Male partner(s)   Other Topics Concern  . Not on file   Social History Narrative  . No narrative on file    Current Outpatient Prescriptions on File Prior to Visit  Medication Sig Dispense Refill  . amLODipine (NORVASC) 10 MG tablet Take 5 mg by mouth daily.       . Cholecalciferol 1000 UNITS tablet Take 1,000 Units by mouth daily.        . cyclobenzaprine (FLEXERIL) 10 MG tablet       . Diclofenac Sodium POWD Trandsdermal Advanced Antiinflammatory compound: Diclofenac 3%, Baclofen %, Orphenadrine 5%, Bupivacaine 2%      . lisinopril-hydrochlorothiazide (PRINZIDE,ZESTORETIC) 20-25 MG per  tablet TAKE ONE TABLET BY MOUTH EVERY DAY  90 tablet  0  . loratadine (CLARITIN) 10 MG tablet Take 10 mg by mouth daily.        Marland Kitchen lovastatin (MEVACOR) 20 MG tablet Take 1 tablet (20 mg total) by mouth at bedtime.  90 tablet  0  . metFORMIN (GLUCOPHAGE) 500 MG tablet Take 500 mg by mouth 2 (two) times daily with a meal.        . testosterone cypionate (DEPOTESTOTERONE CYPIONATE) 200 MG/ML injection Inject 1.5 mLs (300 mg total) into the muscle every 14 (fourteen) days.  10 mL  1   No current facility-administered medications on file prior to visit.    Allergies  Allergen Reactions  . Wellbutrin (Bupropion Hcl)     irritable    Review of Systems  Review of Systems  Constitutional: Positive for malaise/fatigue. Negative for fever.  HENT: Positive for neck pain. Negative for congestion.   Eyes: Negative for discharge.  Respiratory: Negative for shortness  of breath.   Cardiovascular: Negative for chest pain, palpitations and leg swelling.  Gastrointestinal: Negative for nausea, abdominal pain and diarrhea.  Genitourinary: Negative for dysuria.  Musculoskeletal: Positive for joint pain. Negative for falls.       Knee pain, stiffness  Skin: Negative for rash.  Neurological: Negative for loss of consciousness and headaches.  Endo/Heme/Allergies: Negative for polydipsia.  Psychiatric/Behavioral: Negative for depression and suicidal ideas. The patient is not nervous/anxious and does not have insomnia.     Objective  BP 124/88  Pulse 146  Temp(Src) 98 F (36.7 C) (Oral)  Ht 5' 7.75" (1.721 m)  Wt 203 lb 0.6 oz (92.098 kg)  BMI 31.09 kg/m2  SpO2 95%  Physical Exam  Physical Exam  Constitutional: He is oriented to person, place, and time and well-developed, well-nourished, and in no distress. No distress.  HENT:  Head: Normocephalic and atraumatic.  Eyes: Conjunctivae are normal.  Neck: Neck supple. No thyromegaly present.  Cardiovascular: Normal rate, regular rhythm and normal heart sounds.   No murmur heard. Pulmonary/Chest: Effort normal and breath sounds normal. No respiratory distress.  Abdominal: He exhibits no distension and no mass. There is no tenderness.  Musculoskeletal: He exhibits no edema.  Neurological: He is alert and oriented to person, place, and time.  Skin: Skin is warm.  Psychiatric: Memory, affect and judgment normal.    Lab Results  Component Value Date   TSH 0.621 04/03/2012   Lab Results  Component Value Date   WBC 11.0* 04/03/2012   HGB 15.7 04/03/2012   HCT 45.5 04/03/2012   MCV 88.9 04/03/2012   PLT 321 04/03/2012   Lab Results  Component Value Date   CREATININE 0.82 04/03/2012   BUN 17 04/03/2012   NA 133* 04/03/2012   K 4.1 04/03/2012   CL 100 04/03/2012   CO2 24 04/03/2012   Lab Results  Component Value Date   ALT 19 04/03/2012   AST 18 04/03/2012   ALKPHOS 50 04/03/2012   BILITOT 0.4  04/03/2012   Lab Results  Component Value Date   CHOL 235* 04/03/2012   Lab Results  Component Value Date   HDL 42 04/03/2012   Lab Results  Component Value Date   LDLCALC 137* 04/03/2012   Lab Results  Component Value Date   TRIG 278* 04/03/2012   Lab Results  Component Value Date   CHOLHDL 5.6 04/03/2012     Assessment & Plan  HTN (hypertension) Well controlled, no changes today  Diabetes mellitus Well controlled no changes to meds today. Minimize simple carbs.  Sleep apnea Is going to schedule a repeat sleep study at the [place]  Tobacco abuse Encouraged complete cessation  OA (osteoarthritis) Chronic pain [facility], continue current meds

## 2012-10-22 NOTE — Assessment & Plan Note (Signed)
Chronic pain [facility], continue current meds

## 2012-10-22 NOTE — Assessment & Plan Note (Signed)
Well controlled no changes to meds today. Minimize simple carbs.

## 2012-10-22 NOTE — Assessment & Plan Note (Addendum)
Is going to schedule a repeat sleep study at the [facility]

## 2012-10-22 NOTE — Assessment & Plan Note (Signed)
Encouraged complete cessation. 

## 2012-10-22 NOTE — Assessment & Plan Note (Signed)
Well controlled, no changes today 

## 2012-11-12 ENCOUNTER — Telehealth: Payer: Self-pay | Admitting: Family Medicine

## 2012-11-12 DIAGNOSIS — M545 Low back pain: Secondary | ICD-10-CM

## 2012-11-12 DIAGNOSIS — M542 Cervicalgia: Secondary | ICD-10-CM

## 2012-11-12 NOTE — Telephone Encounter (Signed)
Bp readings first reading in AM before taking meds 11/07/12 122/84 pulse 93, 3/28 120/78 89, 3/29 126/86 88, 3/30 130/86 82, 3/31 128/85 82, 4/1 126/82 80

## 2012-11-12 NOTE — Telephone Encounter (Signed)
Are we doing a new RX?

## 2012-11-12 NOTE — Telephone Encounter (Signed)
OK to refill the Percocet by end of week. Same sig, disp #90

## 2012-11-12 NOTE — Telephone Encounter (Signed)
Numbers look good, no change to meds

## 2012-11-13 MED ORDER — OXYCODONE-ACETAMINOPHEN 10-325 MG PO TABS: 1.0000 | ORAL_TABLET | Freq: Three times a day (TID) | ORAL | Status: DC | PRN

## 2012-11-13 NOTE — Telephone Encounter (Signed)
I left a detailed message on pts vm stating RX will be ready on Friday at OR office

## 2012-12-11 ENCOUNTER — Other Ambulatory Visit: Payer: Self-pay | Admitting: Emergency Medicine

## 2012-12-11 DIAGNOSIS — M545 Low back pain: Secondary | ICD-10-CM

## 2012-12-11 DIAGNOSIS — M542 Cervicalgia: Secondary | ICD-10-CM

## 2012-12-11 MED ORDER — OXYCODONE-ACETAMINOPHEN 10-325 MG PO TABS: 1.0000 | ORAL_TABLET | Freq: Three times a day (TID) | ORAL | Status: DC | PRN

## 2012-12-11 NOTE — Telephone Encounter (Signed)
Please advise refill? Last RX was done on 11-13-12 quantity 90 with 0 refills

## 2012-12-11 NOTE — Telephone Encounter (Signed)
Patient needs refill on Oxycodone by Friday May 2,2014.

## 2012-12-12 NOTE — Telephone Encounter (Signed)
Left a message on pts vm stating RX is ready to pick up

## 2013-01-08 ENCOUNTER — Other Ambulatory Visit: Payer: Self-pay | Admitting: Family Medicine

## 2013-01-08 DIAGNOSIS — G8929 Other chronic pain: Secondary | ICD-10-CM

## 2013-01-08 DIAGNOSIS — M545 Low back pain: Secondary | ICD-10-CM

## 2013-01-08 MED ORDER — OXYCODONE-ACETAMINOPHEN 10-325 MG PO TABS: 1.0000 | ORAL_TABLET | Freq: Three times a day (TID) | ORAL | Status: DC | PRN

## 2013-01-08 NOTE — Telephone Encounter (Signed)
Patient is requesting a new prescription of oxycodone °

## 2013-01-08 NOTE — Telephone Encounter (Signed)
RX put in front drawer. Message left for pt

## 2013-01-08 NOTE — Telephone Encounter (Signed)
Please advise refill? Last RX was done on 12-11-12 quantity 90 with 0 refills

## 2013-02-05 ENCOUNTER — Other Ambulatory Visit: Payer: Self-pay | Admitting: Family Medicine

## 2013-02-05 DIAGNOSIS — M545 Low back pain, unspecified: Secondary | ICD-10-CM

## 2013-02-05 DIAGNOSIS — G8929 Other chronic pain: Secondary | ICD-10-CM

## 2013-02-05 DIAGNOSIS — M542 Cervicalgia: Secondary | ICD-10-CM

## 2013-02-05 MED ORDER — OXYCODONE-ACETAMINOPHEN 10-325 MG PO TABS: 1.0000 | ORAL_TABLET | Freq: Three times a day (TID) | ORAL | Status: DC | PRN

## 2013-02-05 NOTE — Telephone Encounter (Signed)
Please advise refill? Last RX was done on 01-08-13 quantity 90 with 0 refills

## 2013-02-05 NOTE — Telephone Encounter (Signed)
Patient called in requesting a new prescription of oxycodone. He states he will pick up prescription on Friday.

## 2013-02-06 NOTE — Telephone Encounter (Signed)
Detailed message left on vm stating RX is ready

## 2013-03-04 ENCOUNTER — Other Ambulatory Visit: Payer: Self-pay | Admitting: Family Medicine

## 2013-03-04 DIAGNOSIS — G8929 Other chronic pain: Secondary | ICD-10-CM

## 2013-03-04 DIAGNOSIS — M545 Low back pain: Secondary | ICD-10-CM

## 2013-03-04 MED ORDER — OXYCODONE-ACETAMINOPHEN 10-325 MG PO TABS: 1.0000 | ORAL_TABLET | Freq: Three times a day (TID) | ORAL | Status: DC | PRN

## 2013-03-04 NOTE — Telephone Encounter (Signed)
RX in front cabinet. Pt informed

## 2013-03-04 NOTE — Telephone Encounter (Signed)
Patient is requesting a new prescription for oxycodone. He would like to pick up 03/06/13

## 2013-03-04 NOTE — Telephone Encounter (Signed)
Please advise Oxy refill? Last RX was done on 02-05-13 quantity 90 with 0 refills

## 2013-04-02 ENCOUNTER — Other Ambulatory Visit: Payer: Self-pay | Admitting: Family Medicine

## 2013-04-02 DIAGNOSIS — M545 Low back pain, unspecified: Secondary | ICD-10-CM

## 2013-04-02 DIAGNOSIS — G8929 Other chronic pain: Secondary | ICD-10-CM

## 2013-04-02 MED ORDER — OXYCODONE-ACETAMINOPHEN 10-325 MG PO TABS: 1.0000 | ORAL_TABLET | Freq: Three times a day (TID) | ORAL | Status: DC | PRN

## 2013-04-02 NOTE — Telephone Encounter (Signed)
Patient is requesting a new prescription for oxycodone.  °

## 2013-04-02 NOTE — Telephone Encounter (Signed)
Please advise Oxy refill? Last RX was done on 03-04-13 quantity 90 with 0 refills

## 2013-04-02 NOTE — Telephone Encounter (Signed)
See rx. 

## 2013-04-03 NOTE — Telephone Encounter (Signed)
Left detailed message on voicemail that rx has been placed at front desk for pick up and to call if any questions.

## 2013-04-30 ENCOUNTER — Telehealth: Payer: Self-pay | Admitting: Family Medicine

## 2013-04-30 DIAGNOSIS — M545 Low back pain: Secondary | ICD-10-CM

## 2013-04-30 DIAGNOSIS — G8929 Other chronic pain: Secondary | ICD-10-CM

## 2013-04-30 NOTE — Telephone Encounter (Signed)
Please advise? Last RX was done on 04-02-13 Quantity 90 with 0 refills  Last OV was 10-17-12  Pt cancelled appt on 05-01-13 and 06-05-13

## 2013-04-30 NOTE — Telephone Encounter (Signed)
It has been 6 months since I saw him I cannot give a full prescription he can have 20 tabs of Oxycodone with same sig to hold him til seen but after that he has to be seen

## 2013-04-30 NOTE — Telephone Encounter (Signed)
Patient is requesting a new refill of oxycodone

## 2013-05-01 ENCOUNTER — Ambulatory Visit: Payer: 59 | Admitting: Family Medicine

## 2013-05-01 MED ORDER — OXYCODONE-ACETAMINOPHEN 10-325 MG PO TABS: 1.0000 | ORAL_TABLET | Freq: Three times a day (TID) | ORAL | Status: DC | PRN

## 2013-05-01 NOTE — Telephone Encounter (Signed)
Patient states that his insurance doesn't go into effect until Oct 14, 14 so he can't come in until then. I scheduled patient an appt on 05-27-13 at 2:15.  Per md ok to refill this one time for the quantity of 90 but if pt doesn't keep the 10-14 appt md won't be able to refill anymore  Pt voiced understanding

## 2013-05-27 ENCOUNTER — Ambulatory Visit (INDEPENDENT_AMBULATORY_CARE_PROVIDER_SITE_OTHER): Payer: 59 | Admitting: Family Medicine

## 2013-05-27 ENCOUNTER — Encounter: Payer: Self-pay | Admitting: Family Medicine

## 2013-05-27 VITALS — BP 142/82 | HR 93 | Temp 98.1°F | Ht 67.78 in | Wt 190.0 lb

## 2013-05-27 DIAGNOSIS — E291 Testicular hypofunction: Secondary | ICD-10-CM

## 2013-05-27 DIAGNOSIS — M545 Low back pain, unspecified: Secondary | ICD-10-CM

## 2013-05-27 DIAGNOSIS — G8929 Other chronic pain: Secondary | ICD-10-CM

## 2013-05-27 DIAGNOSIS — I10 Essential (primary) hypertension: Secondary | ICD-10-CM

## 2013-05-27 DIAGNOSIS — E119 Type 2 diabetes mellitus without complications: Secondary | ICD-10-CM

## 2013-05-27 DIAGNOSIS — E785 Hyperlipidemia, unspecified: Secondary | ICD-10-CM

## 2013-05-27 DIAGNOSIS — Z720 Tobacco use: Secondary | ICD-10-CM

## 2013-05-27 DIAGNOSIS — F17200 Nicotine dependence, unspecified, uncomplicated: Secondary | ICD-10-CM

## 2013-05-27 DIAGNOSIS — R7989 Other specified abnormal findings of blood chemistry: Secondary | ICD-10-CM

## 2013-05-27 DIAGNOSIS — M1990 Unspecified osteoarthritis, unspecified site: Secondary | ICD-10-CM

## 2013-05-27 DIAGNOSIS — M542 Cervicalgia: Secondary | ICD-10-CM

## 2013-05-27 DIAGNOSIS — Z23 Encounter for immunization: Secondary | ICD-10-CM

## 2013-05-27 DIAGNOSIS — E663 Overweight: Secondary | ICD-10-CM

## 2013-05-27 LAB — CBC
HCT: 40.1 % (ref 39.0–52.0)
MCHC: 34.2 g/dL (ref 30.0–36.0)
MCV: 90.5 fL (ref 78.0–100.0)
Platelets: 347 10*3/uL (ref 150–400)
RBC: 4.43 MIL/uL (ref 4.22–5.81)
RDW: 13.8 % (ref 11.5–15.5)
WBC: 10.6 10*3/uL — ABNORMAL HIGH (ref 4.0–10.5)

## 2013-05-27 LAB — RENAL FUNCTION PANEL
Albumin: 4 g/dL (ref 3.5–5.2)
Calcium: 9.3 mg/dL (ref 8.4–10.5)
Creat: 0.71 mg/dL (ref 0.50–1.35)
Glucose, Bld: 92 mg/dL (ref 70–99)
Sodium: 138 mEq/L (ref 135–145)

## 2013-05-27 LAB — LIPID PANEL
Cholesterol: 185 mg/dL (ref 0–200)
HDL: 50 mg/dL (ref >39)
LDL Cholesterol: 97 mg/dL (ref 0–99)
Triglycerides: 190 mg/dL — ABNORMAL HIGH (ref <150)

## 2013-05-27 LAB — HEPATIC FUNCTION PANEL
ALT: 31 U/L (ref 0–53)
Albumin: 4 g/dL (ref 3.5–5.2)
Bilirubin, Direct: 0.1 mg/dL (ref 0.0–0.3)
Total Bilirubin: 0.4 mg/dL (ref 0.3–1.2)

## 2013-05-27 LAB — HEMOGLOBIN A1C: Mean Plasma Glucose: 126 mg/dL — ABNORMAL HIGH (ref <117)

## 2013-05-27 MED ORDER — OXYCODONE-ACETAMINOPHEN 10-325 MG PO TABS: 1.0000 | ORAL_TABLET | Freq: Four times a day (QID) | ORAL | Status: DC | PRN

## 2013-05-27 MED ORDER — LISINOPRIL-HYDROCHLOROTHIAZIDE 20-25 MG PO TABS: 1.0000 | ORAL_TABLET | Freq: Every day | ORAL | Status: DC

## 2013-05-27 NOTE — Assessment & Plan Note (Signed)
Adequate control today, no changes 

## 2013-05-27 NOTE — Patient Instructions (Signed)
Probiotics such as [employer]estive [employer]age caps daily   Hypertension As your heart beats, it forces blood through your arteries. This force is your blood pressure. If the pressure is too high, it is called hypertension (HTN) or high blood pressure. HTN is dangerous because you may have it and not know it. High blood pressure may mean that your heart has to work harder to pump blood. Your arteries may be narrow or stiff. The extra work puts you at risk for heart disease, stroke, and other problems.  Blood pressure consists of two numbers, a higher number over a lower, 110/72, for example. It is stated as "110 over 72." The ideal is below 120 for the top number (systolic) and under 80 for the bottom (diastolic). Write down your blood pressure today. You should pay close attention to your blood pressure if you have certain conditions such as:  Heart failure.  Prior heart attack.  Diabetes  Chronic kidney disease.  Prior stroke.  Multiple risk factors for heart disease. To see if you have HTN, your blood pressure should be measured while you are seated with your arm held at the level of the heart. It should be measured at least twice. A one-time elevated blood pressure reading (especially in the Emergency Department) does not mean that you need treatment. There may be conditions in which the blood pressure is different between your right and left arms. It is important to see your caregiver soon for a recheck. Most people have essential hypertension which means that there is not a specific cause. This type of high blood pressure may be lowered by changing lifestyle factors such as:  Stress.  Smoking.  Lack of exercise.  Excessive weight.  Drug/tobacco/alcohol use.  Eating less salt. Most people do not have symptoms from high blood pressure until it has caused damage to the body. Effective treatment can often prevent, delay or reduce that damage. TREATMENT  When a cause has been identified,  treatment for high blood pressure is directed at the cause. There are a large number of medications to treat HTN. These fall into several categories, and your caregiver will help you select the medicines that are best for you. Medications may have side effects. You should review side effects with your caregiver. If your blood pressure stays high after you have made lifestyle changes or started on medicines,   Your medication(s) may need to be changed.  Other problems may need to be addressed.  Be certain you understand your prescriptions, and know how and when to take your medicine.  Be sure to follow up with your caregiver within the time frame advised (usually within two weeks) to have your blood pressure rechecked and to review your medications.  If you are taking more than one medicine to lower your blood pressure, make sure you know how and at what times they should be taken. Taking two medicines at the same time can result in blood pressure that is too low. SEEK IMMEDIATE MEDICAL CARE IF:  You develop a severe headache, blurred or changing vision, or confusion.  You have unusual weakness or numbness, or a faint feeling.  You have severe chest or abdominal pain, vomiting, or breathing problems. MAKE SURE YOU:   Understand these instructions.  Will watch your condition.  Will get help right away if you are not doing well or get worse. Document Released: 07/31/2005 Document Revised: 10/23/2011 Document Reviewed: 03/20/2008 [facility] Patient Information 2014 [place], [place].

## 2013-05-28 LAB — TESTOSTERONE: Testosterone: 235 ng/dL — ABNORMAL LOW (ref 300–890)

## 2013-05-28 LAB — TSH: TSH: 1.174 u[IU]/mL (ref 0.350–4.500)

## 2013-05-29 ENCOUNTER — Ambulatory Visit: Payer: 59 | Admitting: Family Medicine

## 2013-05-30 ENCOUNTER — Telehealth: Payer: Self-pay

## 2013-05-30 DIAGNOSIS — E349 Endocrine disorder, unspecified: Secondary | ICD-10-CM

## 2013-05-30 MED ORDER — SILDENAFIL CITRATE 100 MG PO TABS: 50.0000 mg | ORAL_TABLET | Freq: Every day | ORAL | Status: DC | PRN

## 2013-05-30 NOTE — Telephone Encounter (Signed)
Sent in rx, patient notified...ds,cma

## 2013-05-30 NOTE — Telephone Encounter (Signed)
He is correct he can have Viagra 50 mg po daily prn ED, disp # 6 with 1 refill

## 2013-05-30 NOTE — Telephone Encounter (Signed)
Patient was notified of his recent lab results today. Pt states you discussed in previous OV sending in a rx for Viagra if labs were good. He would like to know if he can have this sent in now. Please advise.

## 2013-05-31 NOTE — Assessment & Plan Note (Signed)
hgba1c 6.0 no changes to meds, avoid simple carbs

## 2013-05-31 NOTE — Assessment & Plan Note (Signed)
Encouraged complete cessaiton, discussed strategies for quitting for greater than 3 minutes

## 2013-05-31 NOTE — Assessment & Plan Note (Signed)
Improved at present avoid simple carbs and start a krill oil cap for the hi triglycerides

## 2013-05-31 NOTE — Progress Notes (Signed)
Patient ID: Scott Sosa, male   DOB: 12-27-1960, 52 y.o.   MRN: [id number] Scott Sosa [id number] [birth year] 05/31/2013      Progress Note-Follow Up  Subjective  Chief Complaint  Chief Complaint  Patient presents with  . Follow-up  . Injections    flu    HPI  Patient is a 52 year old Caucasian male who is in today for followup. He continues to struggle with chronic pain down another job and is managing to work full-time. Struggles with pain in his back, neck and knees in particular. Continues to smoke one pack per day but does still consider quitting. Denies any recent illness. Denies any chest pain, palpitations, shortness of breath, GI or GU complaints at this time.  Past Medical History  Diagnosis Date  . Allergy     seasonal  . Diabetes mellitus 7-12  . Hypertension   . Chronic lower back pain   . Chicken pox as a teenager  . Arthritis   . Hyperlipidemia   . Low back pain 06/14/2011  . Neck pain, chronic 06/14/2011  . Overweight(278.02) 06/14/2011  . HTN (hypertension) 06/14/2011  . Tobacco abuse 06/14/2011  . 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 Diabetes mellitus 06/14/2011  . Allergic state 06/14/2011  . Vitamin D deficiency   . Tinea corporis 06/20/2011  . Folliculitis 07/13/2011  . Fatigue 07/13/2011  . Anxiety and depression 08/12/2011  . Sleep apnea 09/25/2011  . Testosterone deficiency 09/25/2011  . Foot pain, left 11/21/2011  . OA (osteoarthritis) 11/30/2011    History reviewed. No pertinent past surgical history.  Family History  Problem Relation Age of Onset  . Hyperlipidemia Mother   . Diabetes Mother     type 2  . Thyroid disease Mother   . Arthritis Mother     crippling  . Hyperlipidemia Father   . Cancer Father     gum/ smoked cigars  . Arthritis Sister     rheumatoid  . Other Sister     heart arrythmia  . Heart disease Sister   . Other Sister     bad back  . Arthritis Sister     chronic    History   Social History  . Marital Status:  Married    Spouse Name: N/A    Number of Children: N/A  . Years of Education: N/A   Occupational History  . Not on file.   Social History Main Topics  . Smoking status: Current Every Day Smoker -- 0.50 packs/day for 30 years    Types: Cigarettes  . Smokeless tobacco: Never Used  . Alcohol Use: Yes     Comment: 12 pack of beer a week  . Drug Use: No  . Sexual Activity: Yes    Partners: Female   Other Topics Concern  . Not on file   Social History Narrative  . No narrative on file    Current Outpatient Prescriptions on File Prior to Visit  Medication Sig Dispense Refill  . amLODipine (NORVASC) 10 MG tablet Take 5 mg by mouth daily.       . Cholecalciferol 1000 UNITS tablet Take 1,000 Units by mouth daily.        Marland Kitchen loratadine (CLARITIN) 10 MG tablet Take 10 mg by mouth daily.        . metFORMIN (GLUCOPHAGE) 500 MG tablet Take 500 mg by mouth 2 (two) times daily with a meal.        . metoprolol succinate (TOPROL-XL) 50 MG 24 hr tablet  Take 1 tablet (50 mg total) by mouth daily. Take with or immediately following a meal.  90 tablet  3   No current facility-administered medications on file prior to visit.    Allergies  Allergen Reactions  . Wellbutrin [Bupropion Hcl]     irritable    Review of Systems  Review of Systems  Constitutional: Negative for fever and malaise/fatigue.  HENT: Negative for congestion.   Eyes: Negative for discharge.  Respiratory: Negative for shortness of breath.   Cardiovascular: Negative for chest pain, palpitations and leg swelling.  Gastrointestinal: Negative for nausea, abdominal pain and diarrhea.  Genitourinary: Negative for dysuria.  Musculoskeletal: Positive for back pain, joint pain, myalgias and neck pain. Negative for falls.  Skin: Negative for rash.  Neurological: Negative for loss of consciousness and headaches.  Endo/Heme/Allergies: Negative for polydipsia.  Psychiatric/Behavioral: Negative for depression and suicidal ideas. The  patient is not nervous/anxious and does not have insomnia.     Objective  BP 142/82  Pulse 93  Temp(Src) 98.1 F (36.7 C) (Oral)  Ht 5' 7.78" (1.722 m)  Wt 190 lb (86.183 kg)  BMI 29.1 kg/m2  SpO2 96%  Physical Exam  .ph  Lab Results  Component Value Date   TSH 1.174 05/27/2013   Lab Results  Component Value Date   WBC 10.6* 05/27/2013   HGB 13.7 05/27/2013   HCT 40.1 05/27/2013   MCV 90.5 05/27/2013   PLT 347 05/27/2013   Lab Results  Component Value Date   CREATININE 0.71 05/27/2013   BUN 15 05/27/2013   NA 138 05/27/2013   K 4.2 05/27/2013   CL 102 05/27/2013   CO2 28 05/27/2013   Lab Results  Component Value Date   ALT 31 05/27/2013   AST 30 05/27/2013   ALKPHOS 41 05/27/2013   BILITOT 0.4 05/27/2013   Lab Results  Component Value Date   CHOL 185 05/27/2013   Lab Results  Component Value Date   HDL 50 05/27/2013   Lab Results  Component Value Date   LDLCALC 97 05/27/2013   Lab Results  Component Value Date   TRIG 190* 05/27/2013   Lab Results  Component Value Date   CHOLHDL 3.7 05/27/2013     Assessment & Plan  HTN (hypertension) Adequate control today, no changes  OA (osteoarthritis) Given refills on pain meds today, is still managing to work full time with medication  Tobacco abuse Encouraged complete cessaiton, discussed strategies for quitting for greater than 3 minutes  Diabetes mellitus hgba1c 6.0 no changes to meds, avoid simple carbs  Hyperlipidemia Improved at present avoid simple carbs and start a krill oil cap for the hi triglycerides

## 2013-05-31 NOTE — Assessment & Plan Note (Signed)
Given refills on pain meds today, is still managing to work full time with medication

## 2013-06-05 ENCOUNTER — Ambulatory Visit: Payer: 59 | Admitting: Family Medicine

## 2013-06-06 ENCOUNTER — Ambulatory Visit: Payer: 59 | Admitting: Family Medicine

## 2013-06-24 ENCOUNTER — Telehealth: Payer: Self-pay | Admitting: *Deleted

## 2013-06-24 NOTE — Telephone Encounter (Signed)
OK to refill Percocet same sig, same strength, same #

## 2013-06-24 NOTE — Telephone Encounter (Signed)
Refill request for Percocet 10-325 mg Last filled by MD on - 10.14.14, #[id number] Last AEX - 10.14.14 Next AEX - 16-month Please Advise/SLS

## 2013-06-26 ENCOUNTER — Other Ambulatory Visit: Payer: Self-pay | Admitting: Family Medicine

## 2013-06-26 DIAGNOSIS — G8929 Other chronic pain: Secondary | ICD-10-CM

## 2013-06-26 DIAGNOSIS — M545 Low back pain, unspecified: Secondary | ICD-10-CM

## 2013-06-26 MED ORDER — OXYCODONE-ACETAMINOPHEN 10-325 MG PO TABS: 1.0000 | ORAL_TABLET | Freq: Four times a day (QID) | ORAL | Status: DC | PRN

## 2013-06-26 NOTE — Progress Notes (Signed)
Prescription printed and given to front desk staff to give to patient

## 2013-06-26 NOTE — Telephone Encounter (Signed)
Rx printed for pt p/u; pt informed/SLS

## 2013-07-07 ENCOUNTER — Other Ambulatory Visit: Payer: Self-pay

## 2013-07-07 DIAGNOSIS — I10 Essential (primary) hypertension: Secondary | ICD-10-CM

## 2013-07-07 MED ORDER — LISINOPRIL-HYDROCHLOROTHIAZIDE 20-25 MG PO TABS: 1.0000 | ORAL_TABLET | Freq: Every day | ORAL | Status: DC

## 2013-07-22 ENCOUNTER — Telehealth: Payer: Self-pay | Admitting: Family Medicine

## 2013-07-22 DIAGNOSIS — M545 Low back pain: Secondary | ICD-10-CM

## 2013-07-22 DIAGNOSIS — G8929 Other chronic pain: Secondary | ICD-10-CM

## 2013-07-22 NOTE — Telephone Encounter (Signed)
Requesting refill on oxyCODONE-acetaminophen (PERCOCET) 10-325 MG per tablet

## 2013-07-22 NOTE — Telephone Encounter (Signed)
Rx last printed on 06/26/13, #100. Please advise if ok to reprint Rx for refill?

## 2013-07-23 MED ORDER — OXYCODONE-ACETAMINOPHEN 10-325 MG PO TABS: 1.0000 | ORAL_TABLET | Freq: Four times a day (QID) | ORAL | Status: DC | PRN

## 2013-07-23 NOTE — Telephone Encounter (Signed)
Refill granted.  Quantity 100, no refills.  Further refills to be given by PCP.  Rx placed in envelope and placed at front desk.

## 2013-08-19 ENCOUNTER — Telehealth: Payer: Self-pay | Admitting: Family Medicine

## 2013-08-19 DIAGNOSIS — G8929 Other chronic pain: Secondary | ICD-10-CM

## 2013-08-19 DIAGNOSIS — M545 Low back pain, unspecified: Secondary | ICD-10-CM

## 2013-08-19 DIAGNOSIS — M542 Cervicalgia: Secondary | ICD-10-CM

## 2013-08-19 MED ORDER — OXYCODONE-ACETAMINOPHEN 10-325 MG PO TABS: 1.0000 | ORAL_TABLET | Freq: Four times a day (QID) | ORAL | Status: DC | PRN

## 2013-08-19 NOTE — Telephone Encounter (Signed)
OK to refill on 08/21/13

## 2013-08-19 NOTE — Telephone Encounter (Signed)
Refill oxycodone on 08-21-2013

## 2013-08-19 NOTE — Telephone Encounter (Signed)
Please advise? Pt would like refill on 08-21-13.  Last RX wrote on 07-23-13 quantity 10 with 0 refills

## 2013-08-19 NOTE — Telephone Encounter (Signed)
Left detailed message stating this RX would be ready on the 8th.  RX put at front desk

## 2013-08-29 ENCOUNTER — Encounter: Payer: 59 | Admitting: Family Medicine

## 2013-09-15 ENCOUNTER — Telehealth: Payer: Self-pay | Admitting: *Deleted

## 2013-09-15 DIAGNOSIS — M545 Low back pain, unspecified: Secondary | ICD-10-CM

## 2013-09-15 DIAGNOSIS — M542 Cervicalgia: Secondary | ICD-10-CM

## 2013-09-15 DIAGNOSIS — G8929 Other chronic pain: Secondary | ICD-10-CM

## 2013-09-15 MED ORDER — OXYCODONE-ACETAMINOPHEN 10-325 MG PO TABS: 1.0000 | ORAL_TABLET | Freq: Four times a day (QID) | ORAL | Status: DC | PRN

## 2013-09-15 NOTE — Telephone Encounter (Signed)
Pt called and left message requesting written Rx for oxycodone that he states is due on 09/18/13. 08/19/13 Rx stated it was due on 08/21/13.  Rx printed and forwarded to Provider for signature.

## 2013-09-15 NOTE — Telephone Encounter (Signed)
Pt informed and RX put up front

## 2013-10-07 ENCOUNTER — Other Ambulatory Visit: Payer: Self-pay

## 2013-10-07 DIAGNOSIS — I10 Essential (primary) hypertension: Secondary | ICD-10-CM

## 2013-10-07 MED ORDER — LISINOPRIL-HYDROCHLOROTHIAZIDE 20-25 MG PO TABS: 1.0000 | ORAL_TABLET | Freq: Every day | ORAL | Status: DC

## 2013-10-13 ENCOUNTER — Other Ambulatory Visit: Payer: Self-pay

## 2013-10-13 DIAGNOSIS — M545 Low back pain, unspecified: Secondary | ICD-10-CM

## 2013-10-13 DIAGNOSIS — M542 Cervicalgia: Secondary | ICD-10-CM

## 2013-10-13 DIAGNOSIS — G8929 Other chronic pain: Secondary | ICD-10-CM

## 2013-10-13 MED ORDER — OXYCODONE-ACETAMINOPHEN 10-325 MG PO TABS
1.0000 | ORAL_TABLET | Freq: Four times a day (QID) | ORAL | Status: DC | PRN
Start: 2013-10-16 — End: 2013-11-10

## 2013-10-13 NOTE — Telephone Encounter (Signed)
Pt left a message stating he needs his oxy refilled for 10-16-13?  Last RX was done on 09-18-13 quantity 100 with 0 refills  Please advise?

## 2013-10-13 NOTE — Telephone Encounter (Signed)
Left a detailed message on patients vm stating that this was ready to be picked up

## 2013-10-14 ENCOUNTER — Encounter: Payer: 59 | Admitting: Family Medicine

## 2013-10-24 ENCOUNTER — Encounter: Payer: 59 | Admitting: Family Medicine

## 2013-11-10 ENCOUNTER — Telehealth: Payer: Self-pay | Admitting: Family Medicine

## 2013-11-10 ENCOUNTER — Encounter: Payer: Self-pay | Admitting: Family Medicine

## 2013-11-10 ENCOUNTER — Ambulatory Visit (INDEPENDENT_AMBULATORY_CARE_PROVIDER_SITE_OTHER): Payer: 59 | Admitting: Family Medicine

## 2013-11-10 VITALS — BP 128/82 | HR 94 | Temp 98.2°F | Ht 67.78 in | Wt 205.1 lb

## 2013-11-10 DIAGNOSIS — E785 Hyperlipidemia, unspecified: Secondary | ICD-10-CM

## 2013-11-10 DIAGNOSIS — F17200 Nicotine dependence, unspecified, uncomplicated: Secondary | ICD-10-CM

## 2013-11-10 DIAGNOSIS — G8929 Other chronic pain: Secondary | ICD-10-CM

## 2013-11-10 DIAGNOSIS — Z720 Tobacco use: Secondary | ICD-10-CM

## 2013-11-10 DIAGNOSIS — M25569 Pain in unspecified knee: Secondary | ICD-10-CM

## 2013-11-10 DIAGNOSIS — I10 Essential (primary) hypertension: Secondary | ICD-10-CM

## 2013-11-10 DIAGNOSIS — M545 Low back pain, unspecified: Secondary | ICD-10-CM

## 2013-11-10 DIAGNOSIS — E349 Endocrine disorder, unspecified: Secondary | ICD-10-CM

## 2013-11-10 DIAGNOSIS — E119 Type 2 diabetes mellitus without complications: Secondary | ICD-10-CM

## 2013-11-10 DIAGNOSIS — M25561 Pain in right knee: Secondary | ICD-10-CM

## 2013-11-10 DIAGNOSIS — M542 Cervicalgia: Secondary | ICD-10-CM

## 2013-11-10 MED ORDER — OXYCODONE-ACETAMINOPHEN 10-325 MG PO TABS: 1.0000 | ORAL_TABLET | Freq: Four times a day (QID) | ORAL | Status: DC | PRN

## 2013-11-10 NOTE — Progress Notes (Signed)
Pre visit review using our clinic review tool, if applicable. No additional management support is needed unless otherwise documented below in the visit note. 

## 2013-11-10 NOTE — Telephone Encounter (Signed)
Relevant patient education mailed to patient.  

## 2013-11-10 NOTE — Telephone Encounter (Signed)
Lab order week of January 12 2014 : Return for lipid, renal, cbc, tsh, hepatic, hgba1c prior. Labs prior with testosterone and psa prior to June 2015 PE

## 2013-11-10 NOTE — Progress Notes (Signed)
Patient ID: Scott Sosa, male   DOB: [birth year], 53 y.o.   MRN: [id number] Scott Sosa [id number] [birth year] 11/10/2013      Progress Note-Follow Up  Subjective  Chief Complaint  Chief Complaint  Patient presents with  . Medication Refill    right knee: partial dislocation for long time and gettting worse lately, referral to orthopedic.    HPI  Patient is a 53 year old male in today for routine medical care.  He is here today for follow up on numerous occasions and to discuss recent increase in right knee pain. During the winter he slipped on ice while lifting his dog who just been hit by a car. Short time after that he began to have more instability and pain in his knee. He says the pain is across the front of the knee the instability appears lateral. He will take a step and his knee will feel as if it is popping out laterally and he'll have 10 out of 10 pain. He is able to manipulate the knee and popping back in place but the pain persists. There's been mild swelling but no erythema. No radicular symptoms beyond that. Otherwise he feels fairly well. Unfortunately continues to smoke.Denies CP/palp/SOB/HA/congestion/fevers/GI or GU c/o. Taking meds as prescribed  Past Medical History  Diagnosis Date  . Allergy     seasonal  . Diabetes mellitus 7-12  . Hypertension   . Chronic lower back pain   . Chicken pox as a teenager  . Arthritis   . Hyperlipidemia   . Low back pain 06/14/2011  . Neck pain, chronic 06/14/2011  . Overweight 06/14/2011  . HTN (hypertension) 06/14/2011  . Tobacco abuse 06/14/2011  . 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 Diabetes mellitus 06/14/2011  . Allergic state 06/14/2011  . Vitamin D deficiency   . Tinea corporis 06/20/2011  . Folliculitis 07/13/2011  . Fatigue 07/13/2011  . Anxiety and depression 08/12/2011  . Sleep apnea 09/25/2011  . Testosterone deficiency 09/25/2011  . Foot pain, left 11/21/2011  . OA (osteoarthritis) 11/30/2011    History reviewed. No  pertinent past surgical history.  Family History  Problem Relation Age of Onset  . Hyperlipidemia Mother   . Diabetes Mother     type 2  . Thyroid disease Mother   . Arthritis Mother     crippling  . Hyperlipidemia Father   . Cancer Father     gum/ smoked cigars  . Arthritis Sister     rheumatoid  . Other Sister     heart arrythmia  . Heart disease Sister   . Other Sister     bad back  . Arthritis Sister     chronic    History   Social History  . Marital Status: Married    Spouse Name: N/A    Number of Children: N/A  . Years of Education: N/A   Occupational History  . Not on file.   Social History Main Topics  . Smoking status: Current Every Day Smoker -- 0.50 packs/day for 30 years    Types: Cigarettes  . Smokeless tobacco: Never Used  . Alcohol Use: Yes     Comment: 12 pack of beer a week  . Drug Use: No  . Sexual Activity: Yes    Partners: Female   Other Topics Concern  . Not on file   Social History Narrative  . No narrative on file    Current Outpatient Prescriptions on File Prior to Visit  Medication Sig  Dispense Refill  . amLODipine (NORVASC) 10 MG tablet Take 5 mg by mouth daily.       . Cholecalciferol 1000 UNITS tablet Take 1,000 Units by mouth daily.        Marland Kitchen. lisinopril-hydrochlorothiazide (PRINZIDE,ZESTORETIC) 20-25 MG per tablet Take 1 tablet by mouth daily.  90 tablet  0  . loratadine (CLARITIN) 10 MG tablet Take 10 mg by mouth daily.        . metFORMIN (GLUCOPHAGE) 500 MG tablet Take 500 mg by mouth 2 (two) times daily with a meal.        . metoprolol succinate (TOPROL-XL) 50 MG 24 hr tablet Take 1 tablet (50 mg total) by mouth daily. Take with or immediately following a meal.  90 tablet  3  . oxyCODONE-acetaminophen (PERCOCET) 10-325 MG per tablet Take 1 tablet by mouth every 6 (six) hours as needed for pain. RX for October 16, 2013  100 tablet  0  . sildenafil (VIAGRA) 100 MG tablet Take 0.5 tablets (50 mg total) by mouth daily as needed for  erectile dysfunction.  6 tablet  1   No current facility-administered medications on file prior to visit.    Allergies  Allergen Reactions  . Wellbutrin [Bupropion Hcl]     irritable    Review of Systems  Review of Systems  Constitutional: Positive for malaise/fatigue. Negative for fever.  HENT: Negative for congestion.   Eyes: Negative for discharge.  Respiratory: Negative for shortness of breath.   Cardiovascular: Negative for chest pain, palpitations and leg swelling.  Gastrointestinal: Negative for nausea, abdominal pain and diarrhea.  Genitourinary: Negative for dysuria.  Musculoskeletal: Positive for joint pain. Negative for falls.       Right knee pain, dislocation to lateral aspect intermittently  Skin: Negative for rash.  Neurological: Negative for loss of consciousness and headaches.  Endo/Heme/Allergies: Negative for polydipsia.  Psychiatric/Behavioral: Negative for depression and suicidal ideas. The patient is not nervous/anxious and does not have insomnia.     Objective  BP 128/82  Pulse 105  Temp(Src) 98.2 F (36.8 C) (Oral)  Ht 5' 7.78" (1.722 m)  Wt 205 lb 1.3 oz (93.024 kg)  BMI 31.37 kg/m2  SpO2 99%  Physical Exam  Physical Exam  Constitutional: He is oriented to person, place, and time and well-developed, well-nourished, and in no distress. No distress.  HENT:  Head: Normocephalic and atraumatic.  Eyes: Conjunctivae are normal.  Neck: Neck supple. No thyromegaly present.  Cardiovascular: Normal rate, regular rhythm and normal heart sounds.   No murmur heard. Pulmonary/Chest: Effort normal and breath sounds normal. No respiratory distress.  Abdominal: He exhibits no distension and no mass. There is no tenderness.  Musculoskeletal: He exhibits no edema.  Neurological: He is alert and oriented to person, place, and time.  Skin: Skin is warm.  Psychiatric: Memory, affect and judgment normal.    Lab Results  Component Value Date   TSH 1.174  05/27/2013   Lab Results  Component Value Date   WBC 10.6* 05/27/2013   HGB 13.7 05/27/2013   HCT 40.1 05/27/2013   MCV 90.5 05/27/2013   PLT 347 05/27/2013   Lab Results  Component Value Date   CREATININE 0.71 05/27/2013   BUN 15 05/27/2013   NA 138 05/27/2013   K 4.2 05/27/2013   CL 102 05/27/2013   CO2 28 05/27/2013   Lab Results  Component Value Date   ALT 31 05/27/2013   AST 30 05/27/2013   ALKPHOS 41 05/27/2013  BILITOT 0.4 05/27/2013   Lab Results  Component Value Date   CHOL 185 05/27/2013   Lab Results  Component Value Date   HDL 50 05/27/2013   Lab Results  Component Value Date   LDLCALC 97 05/27/2013   Lab Results  Component Value Date   TRIG 190* 05/27/2013   Lab Results  Component Value Date   CHOLHDL 3.7 05/27/2013     Assessment & Plan   HTN (hypertension) Well controlled, no changes to meds. Encouraged heart healthy diet such as the DASH diet and exercise as tolerated.   Low back pain Allowed refill on pain meds today  Right knee pain Began having instability and increased pain in his knee after a fall on the ice. He reports his keeps popping out and pain increasing. Will refer to orthopaedics for further consideration  Tobacco abuse Encouraged complete cessation. Discussed need to quit as relates to risk of numerous cancers, cardiac and pulmonary disease as well as neurologic complications. Counseled for greater than 3 minutes

## 2013-11-11 ENCOUNTER — Telehealth: Payer: Self-pay | Admitting: Family Medicine

## 2013-11-11 NOTE — Telephone Encounter (Signed)
Relevant patient education mailed to patient.  

## 2013-11-12 ENCOUNTER — Telehealth: Payer: Self-pay | Admitting: Family Medicine

## 2013-11-12 DIAGNOSIS — M25561 Pain in right knee: Secondary | ICD-10-CM | POA: Insufficient documentation

## 2013-11-12 NOTE — Assessment & Plan Note (Signed)
Began having instability and increased pain in his knee after a fall on the ice. He reports his keeps popping out and pain increasing. Will refer to orthopaedics for further consideration

## 2013-11-12 NOTE — Assessment & Plan Note (Signed)
Well controlled, no changes to meds. Encouraged heart healthy diet such as the DASH diet and exercise as tolerated.  °

## 2013-11-12 NOTE — Assessment & Plan Note (Signed)
Allowed refill on pain meds today 

## 2013-11-12 NOTE — Telephone Encounter (Signed)
[facility] [place]

## 2013-11-12 NOTE — Assessment & Plan Note (Signed)
Encouraged complete cessation. Discussed need to quit as relates to risk of numerous cancers, cardiac and pulmonary disease as well as neurologic complications. Counseled for greater than 3 minutes 

## 2013-11-13 ENCOUNTER — Encounter: Payer: Self-pay | Admitting: Family Medicine

## 2013-12-04 ENCOUNTER — Telehealth: Payer: Self-pay

## 2013-12-04 NOTE — Telephone Encounter (Signed)
Relevant patient education mailed to patient.  

## 2013-12-09 ENCOUNTER — Telehealth: Payer: Self-pay | Admitting: Family Medicine

## 2013-12-09 DIAGNOSIS — M545 Low back pain, unspecified: Secondary | ICD-10-CM

## 2013-12-09 DIAGNOSIS — M542 Cervicalgia: Secondary | ICD-10-CM

## 2013-12-09 DIAGNOSIS — G8929 Other chronic pain: Secondary | ICD-10-CM

## 2013-12-09 MED ORDER — OXYCODONE-ACETAMINOPHEN 10-325 MG PO TABS: 1.0000 | ORAL_TABLET | Freq: Four times a day (QID) | ORAL | Status: DC | PRN

## 2013-12-09 NOTE — Telephone Encounter (Signed)
Last RX was done on 11-10-13 quantity 100 with 0 refills  Last ov was 11-10-13  RX printed for md to sign and be at the front desk

## 2013-12-09 NOTE — Telephone Encounter (Signed)
Patient is requesting a refill of oxy

## 2014-01-06 ENCOUNTER — Other Ambulatory Visit: Payer: Self-pay

## 2014-01-06 DIAGNOSIS — G8929 Other chronic pain: Secondary | ICD-10-CM

## 2014-01-06 DIAGNOSIS — M542 Cervicalgia: Secondary | ICD-10-CM

## 2014-01-06 DIAGNOSIS — M545 Low back pain, unspecified: Secondary | ICD-10-CM

## 2014-01-06 DIAGNOSIS — I10 Essential (primary) hypertension: Secondary | ICD-10-CM

## 2014-01-06 MED ORDER — LISINOPRIL-HYDROCHLOROTHIAZIDE 20-25 MG PO TABS: 1.0000 | ORAL_TABLET | Freq: Every day | ORAL | Status: DC

## 2014-01-06 MED ORDER — OXYCODONE-ACETAMINOPHEN 10-325 MG PO TABS: 1.0000 | ORAL_TABLET | Freq: Four times a day (QID) | ORAL | Status: DC | PRN

## 2014-01-06 NOTE — Telephone Encounter (Signed)
RX printed for md to sign  Last RX was wrote on 12-09-13 quantity 100 with 0 refills

## 2014-01-21 ENCOUNTER — Other Ambulatory Visit: Payer: Self-pay | Admitting: Family Medicine

## 2014-01-21 LAB — HEPATIC FUNCTION PANEL
ALT: 42 U/L (ref 0–53)
AST: 33 U/L (ref 0–37)
Albumin: 4.2 g/dL (ref 3.5–5.2)
Alkaline Phosphatase: 68 U/L (ref 39–117)
BILIRUBIN INDIRECT: 0.3 mg/dL (ref 0.2–1.2)
BILIRUBIN TOTAL: 0.4 mg/dL (ref 0.2–1.2)
Bilirubin, Direct: 0.1 mg/dL (ref 0.0–0.3)
TOTAL PROTEIN: 6.9 g/dL (ref 6.0–8.3)

## 2014-01-21 LAB — CBC
HCT: 42.2 % (ref 39.0–52.0)
Hemoglobin: 14.6 g/dL (ref 13.0–17.0)
MCH: 31.6 pg (ref 26.0–34.0)
MCHC: 34.6 g/dL (ref 30.0–36.0)
MCV: 91.3 fL (ref 78.0–100.0)
PLATELETS: 271 10*3/uL (ref 150–400)
RBC: 4.62 MIL/uL (ref 4.22–5.81)
RDW: 13.9 % (ref 11.5–15.5)
WBC: 10 10*3/uL (ref 4.0–10.5)

## 2014-01-21 LAB — LIPID PANEL
CHOL/HDL RATIO: 4.5 ratio
CHOLESTEROL: 244 mg/dL — AB (ref 0–200)
HDL: 54 mg/dL (ref >39)
Triglycerides: 504 mg/dL — ABNORMAL HIGH (ref <150)

## 2014-01-21 LAB — RENAL FUNCTION PANEL
Albumin: 4.2 g/dL (ref 3.5–5.2)
BUN: 12 mg/dL (ref 6–23)
CHLORIDE: 101 meq/L (ref 96–112)
CO2: 26 mEq/L (ref 19–32)
Calcium: 8.9 mg/dL (ref 8.4–10.5)
Creat: 0.68 mg/dL (ref 0.50–1.35)
Glucose, Bld: 96 mg/dL (ref 70–99)
PHOSPHORUS: 2.6 mg/dL (ref 2.3–4.6)
POTASSIUM: 4 meq/L (ref 3.5–5.3)
Sodium: 137 mEq/L (ref 135–145)

## 2014-01-21 LAB — HEMOGLOBIN A1C
Hgb A1c MFr Bld: 5.8 % — ABNORMAL HIGH (ref <5.7)
MEAN PLASMA GLUCOSE: 120 mg/dL — AB (ref <117)

## 2014-01-21 LAB — TSH: TSH: 1.858 u[IU]/mL (ref 0.350–4.500)

## 2014-01-22 LAB — PSA: PSA: 0.88 ng/mL (ref <=4.00)

## 2014-01-22 LAB — TESTOSTERONE, FREE, TOTAL, SHBG
SEX HORMONE BINDING: 34 nmol/L (ref 13–71)
Testosterone, Free: 44 pg/mL — ABNORMAL LOW (ref 47.0–244.0)
Testosterone-% Free: 1.9 % (ref 1.6–2.9)
Testosterone: 233 ng/dL — ABNORMAL LOW (ref 300–890)

## 2014-01-27 ENCOUNTER — Ambulatory Visit (INDEPENDENT_AMBULATORY_CARE_PROVIDER_SITE_OTHER): Payer: 59 | Admitting: Family Medicine

## 2014-01-27 ENCOUNTER — Encounter: Payer: Self-pay | Admitting: Family Medicine

## 2014-01-27 VITALS — BP 120/86 | HR 126 | Temp 98.7°F | Ht 67.75 in | Wt 212.0 lb

## 2014-01-27 DIAGNOSIS — R000 Tachycardia, unspecified: Secondary | ICD-10-CM

## 2014-01-27 DIAGNOSIS — G8929 Other chronic pain: Secondary | ICD-10-CM

## 2014-01-27 DIAGNOSIS — I10 Essential (primary) hypertension: Secondary | ICD-10-CM

## 2014-01-27 DIAGNOSIS — E349 Endocrine disorder, unspecified: Secondary | ICD-10-CM

## 2014-01-27 DIAGNOSIS — M25569 Pain in unspecified knee: Secondary | ICD-10-CM

## 2014-01-27 DIAGNOSIS — Z720 Tobacco use: Secondary | ICD-10-CM

## 2014-01-27 DIAGNOSIS — M542 Cervicalgia: Secondary | ICD-10-CM

## 2014-01-27 DIAGNOSIS — E663 Overweight: Secondary | ICD-10-CM

## 2014-01-27 DIAGNOSIS — Z0000 Encounter for general adult medical examination without abnormal findings: Secondary | ICD-10-CM

## 2014-01-27 DIAGNOSIS — M25561 Pain in right knee: Secondary | ICD-10-CM

## 2014-01-27 DIAGNOSIS — E291 Testicular hypofunction: Secondary | ICD-10-CM

## 2014-01-27 DIAGNOSIS — F17200 Nicotine dependence, unspecified, uncomplicated: Secondary | ICD-10-CM

## 2014-01-27 DIAGNOSIS — M545 Low back pain, unspecified: Secondary | ICD-10-CM

## 2014-01-27 DIAGNOSIS — E11628 Type 2 diabetes mellitus with other skin complications: Secondary | ICD-10-CM

## 2014-01-27 DIAGNOSIS — E1169 Type 2 diabetes mellitus with other specified complication: Secondary | ICD-10-CM

## 2014-01-27 DIAGNOSIS — L988 Other specified disorders of the skin and subcutaneous tissue: Secondary | ICD-10-CM

## 2014-01-27 MED ORDER — METOPROLOL TARTRATE 50 MG PO TABS: 50.0000 mg | ORAL_TABLET | Freq: Two times a day (BID) | ORAL | Status: DC

## 2014-01-27 MED ORDER — OXYCODONE-ACETAMINOPHEN 10-325 MG PO TABS: 1.0000 | ORAL_TABLET | Freq: Four times a day (QID) | ORAL | Status: DC | PRN

## 2014-01-27 NOTE — Patient Instructions (Signed)
Nicotine Addiction Nicotine can act as both a stimulant (excites/activates) and a sedative (calms/quiets). Immediately after exposure to nicotine, there is a "kick" caused in part by the drug's stimulation of the adrenal glands and resulting discharge of adrenaline (epinephrine). The rush of adrenaline stimulates the body and causes a sudden release of sugar. This means that smokers are always slightly hyperglycemic. Hyperglycemic means that the blood sugar is high, just like in diabetics. Nicotine also decreases the amount of insulin which helps control sugar levels in the body. There is an increase in blood pressure, breathing, and the rate of heart beats.  In addition, nicotine indirectly causes a release of dopamine in the brain that controls pleasure and motivation. A similar reaction is seen with other drugs of abuse, such as cocaine and heroin. This dopamine release is thought to cause the pleasurable sensations when smoking. In some different cases, nicotine can also create a calming effect, depending on sensitivity of the smoker's nervous system and the dose of nicotine taken. WHAT HAPPENS WHEN NICOTINE IS TAKEN FOR LONG PERIODS OF TIME?  Long-term use of nicotine results in addiction. It is difficult to stop.  Repeated use of nicotine creates tolerance. Higher doses of nicotine are needed to get the "kick." When nicotine use is stopped, withdrawal may last a month or more. Withdrawal may begin within a few hours after the last cigarette. Symptoms peak within the first few days and may lessen within a few weeks. For some people, however, symptoms may last for months or longer. Withdrawal symptoms include:   Irritability.  Craving.  Learning and attention deficits.  Sleep disturbances.  Increased appetite. Craving for tobacco may last for 6 months or longer. Many behaviors done while using nicotine can also play a part in the severity of withdrawal symptoms. For some people, the feel,  smell, and sight of a cigarette and the ritual of obtaining, handling, lighting, and smoking the cigarette are closely linked with the pleasure of smoking. When stopped, they also miss the related behaviors which make the withdrawal or craving worse. While nicotine gum and patches may lessen the drug aspects of withdrawal, cravings often persist. WHAT ARE THE MEDICAL CONSEQUENCES OF NICOTINE USE?  Nicotine addiction accounts for one-third of all cancers. The top cancer caused by tobacco is lung cancer. Lung cancer is the number one cancer killer of both men and women.  Smoking is also associated with cancers of the:  Mouth.  Pharynx.  Larynx.  Esophagus.  Stomach.  Pancreas.  Cervix.  Kidney.  Ureter.  Bladder.  Smoking also causes lung diseases such as lasting (chronic) bronchitis and emphysema.  It worsens asthma in adults and children.  Smoking increases the risk of heart disease, including:  Stroke.  Heart attack.  Vascular disease.  Aneurysm.  Passive or secondary smoke can also increase medical risks including:  Asthma in children.  Sudden Infant Death Syndrome (SIDS).  Additionally, dropped cigarettes are the leading cause of residential fire fatalities.  Nicotine poisoning has been reported from accidental ingestion of tobacco products by children and pets. Death usually results in a few minutes from respiratory failure (when a person stops breathing) caused by paralysis. TREATMENT   Medication. Nicotine replacement medicines such as nicotine gum and the patch are used to stop smoking. These medicines gradually lower the dosage of nicotine in the body. These medicines do not contain the carbon monoxide and other toxins found in tobacco smoke.  Hypnotherapy.  Relaxation therapy.  Nicotine Anonymous (a 12-step support   program). Find times and locations in your local yellow pages. Document Released: 04/05/2004 Document Revised: 10/23/2011 Document  Reviewed: 08/28/2007 Exit[employer] Patient Information 2014 [employer].  

## 2014-01-27 NOTE — Progress Notes (Signed)
Pre visit review using our clinic review tool, if applicable. No additional management support is needed unless otherwise documented below in the visit note. 

## 2014-01-27 NOTE — Progress Notes (Signed)
Patient ID: Scott Sosa, male   DOB: 12/02/1960, 53 y.o.   MRN: [id number] Scott Sosa [id number] 12/02/1960 01/27/2014      Progress Note-Follow Up  Subjective  Chief Complaint  Chief Complaint  Patient presents with  . Annual Exam    physical    HPI  Patient is a 53 year old male in today for routine medical care. Patient is in today for annual exam. Continues to struggle with right knee pain as well as some low back pain and some right foot pain. Has previously been told he has bone spurs. No redness, swelling or warmth. Has not been using medications for pain. Unfortunately continues to smoke 1 to 1/2 Pack per day. No recent illness or acute concerns otherwise. Denies CP/palp/SOB/HA/congestion/fevers/GI or GU c/o. Taking meds as prescribed  Past Medical History  Diagnosis Date  . Allergy     seasonal  . Diabetes mellitus 7-12  . Hypertension   . Chronic lower back pain   . Chicken pox as a teenager  . Arthritis   . Hyperlipidemia   . Low back pain 06/14/2011  . Neck pain, chronic 06/14/2011  . Overweight 06/14/2011  . HTN (hypertension) 06/14/2011  . Tobacco abuse 06/14/2011  . 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 Diabetes mellitus 06/14/2011  . Allergic state 06/14/2011  . Vitamin D deficiency   . Tinea corporis 06/20/2011  . Folliculitis 07/13/2011  . Fatigue 07/13/2011  . Anxiety and depression 08/12/2011  . Sleep apnea 09/25/2011  . Testosterone deficiency 09/25/2011  . Foot pain, left 11/21/2011  . OA (osteoarthritis) 11/30/2011    History reviewed. No pertinent past surgical history.  Family History  Problem Relation Age of Onset  . Hyperlipidemia Mother   . Diabetes Mother     type 2  . Thyroid disease Mother   . Arthritis Mother     crippling  . Hyperlipidemia Father   . Cancer Father     gum/ smoked cigars  . Arthritis Sister     rheumatoid  . Other Sister     heart arrythmia  . Heart disease Sister   . Other Sister     bad back  . Arthritis  Sister     chronic    History   Social History  . Marital Status: Married    Spouse Name: N/A    Number of Children: N/A  . Years of Education: N/A   Occupational History  . Not on file.   Social History Main Topics  . Smoking status: Current Every Day Smoker -- 0.50 packs/day for 30 years    Types: Cigarettes  . Smokeless tobacco: Never Used  . Alcohol Use: Yes     Comment: 12 pack of beer a week  . Drug Use: No  . Sexual Activity: Yes    Partners: Female   Other Topics Concern  . Not on file   Social History Narrative  . No narrative on file    Current Outpatient Prescriptions on File Prior to Visit  Medication Sig Dispense Refill  . lisinopril-hydrochlorothiazide (PRINZIDE,ZESTORETIC) 20-25 MG per tablet Take 1 tablet by mouth daily.  90 tablet  0  . loratadine (CLARITIN) 10 MG tablet Take 10 mg by mouth daily.        . metFORMIN (GLUCOPHAGE) 500 MG tablet Take 500 mg by mouth 2 (two) times daily with a meal.        . metoprolol succinate (TOPROL-XL) 50 MG 24 hr tablet Take 1 tablet (50  mg total) by mouth daily. Take with or immediately following a meal.  90 tablet  3  . oxyCODONE-acetaminophen (PERCOCET) 10-325 MG per tablet Take 1 tablet by mouth every 6 (six) hours as needed for pain. RX for April 2015  100 tablet  0  . sildenafil (VIAGRA) 100 MG tablet Take 0.5 tablets (50 mg total) by mouth daily as needed for erectile dysfunction.  6 tablet  1   No current facility-administered medications on file prior to visit.    Allergies  Allergen Reactions  . Wellbutrin [Bupropion Hcl]     irritable    Review of Systems  Review of Systems  Constitutional: Negative for fever and malaise/fatigue.  HENT: Negative for congestion.   Eyes: Negative for discharge.  Respiratory: Negative for shortness of breath.   Cardiovascular: Negative for chest pain, palpitations and leg swelling.  Gastrointestinal: Negative for nausea, abdominal pain and diarrhea.  Genitourinary:  Negative for dysuria.  Musculoskeletal: Negative for falls.  Skin: Negative for rash.  Neurological: Negative for loss of consciousness and headaches.  Endo/Heme/Allergies: Negative for polydipsia.  Psychiatric/Behavioral: Negative for depression and suicidal ideas. The patient is not nervous/anxious and does not have insomnia.     Objective  BP 120/86  Pulse 126  Temp(Src) 98.7 F (37.1 C) (Oral)  Ht 5' 7.75" (1.721 m)  Wt 212 lb (96.163 kg)  BMI 32.47 kg/m2  SpO2 99%  Physical Exam  Physical Exam  Constitutional: He is oriented to person, place, and time and well-developed, well-nourished, and in no distress. No distress.  HENT:  Head: Normocephalic and atraumatic.  Eyes: Conjunctivae are normal.  Neck: Neck supple. No thyromegaly present.  Cardiovascular: Normal rate, regular rhythm and normal heart sounds.   No murmur heard. Pulmonary/Chest: Effort normal and breath sounds normal. No respiratory distress.  Abdominal: He exhibits no distension and no mass. There is no tenderness.  Musculoskeletal: He exhibits no edema.  Neurological: He is alert and oriented to person, place, and time.  Skin: Skin is warm.  Psychiatric: Memory, affect and judgment normal.    Lab Results  Component Value Date   TSH 1.858 01/21/2014   Lab Results  Component Value Date   WBC 10.0 01/21/2014   HGB 14.6 01/21/2014   HCT 42.2 01/21/2014   MCV 91.3 01/21/2014   PLT 271 01/21/2014   Lab Results  Component Value Date   CREATININE 0.68 01/21/2014   BUN 12 01/21/2014   NA 137 01/21/2014   K 4.0 01/21/2014   CL 101 01/21/2014   CO2 26 01/21/2014   Lab Results  Component Value Date   ALT 42 01/21/2014   AST 33 01/21/2014   ALKPHOS 68 01/21/2014   BILITOT 0.4 01/21/2014   Lab Results  Component Value Date   CHOL 244* 01/21/2014   Lab Results  Component Value Date   HDL 54 01/21/2014   Lab Results  Component Value Date   LDLCALC NOT CALC 01/21/2014   Lab Results  Component Value Date    TRIG 504* 01/21/2014   Lab Results  Component Value Date   CHOLHDL 4.5 01/21/2014     Assessment & Plan

## 2014-02-04 DIAGNOSIS — Z0000 Encounter for general adult medical examination without abnormal findings: Secondary | ICD-10-CM | POA: Insufficient documentation

## 2014-02-04 NOTE — Assessment & Plan Note (Signed)
And right foot pain, declines referral today but is encouraged to try orthotics, ice and topical treatments and to call if does not resolve

## 2014-02-04 NOTE — Assessment & Plan Note (Signed)
Encouraged complete cessation. Discussed need to quit as relates to risk of numerous cancers, cardiac and pulmonary disease as well as neurologic complications. Counseled for greater than 3 minutes 

## 2014-02-04 NOTE — Assessment & Plan Note (Signed)
Encouraged DASH diet, decrease po intake and increase exercise as tolerated. Needs 7-8 hours of sleep nightly. Avoid trans fats, eat small, frequent meals every 4-5 hours with lean proteins, complex carbs and healthy fats. Minimize simple carbs, GMO foods. 

## 2014-02-04 NOTE — Assessment & Plan Note (Signed)
hgba1c acceptable, minimize simple carbs. Increase exercise as tolerated. Continue current meds 

## 2014-02-04 NOTE — Assessment & Plan Note (Signed)
Low but tolerale will continue to monitor

## 2014-02-04 NOTE — Assessment & Plan Note (Signed)
Well controlled, no changes to meds. Encouraged heart healthy diet such as the DASH diet and exercise as tolerated.  °

## 2014-02-24 ENCOUNTER — Telehealth: Payer: Self-pay | Admitting: Family Medicine

## 2014-02-24 DIAGNOSIS — G8929 Other chronic pain: Secondary | ICD-10-CM

## 2014-02-24 DIAGNOSIS — M542 Cervicalgia: Principal | ICD-10-CM

## 2014-02-24 MED ORDER — OXYCODONE-ACETAMINOPHEN 10-325 MG PO TABS: 1.0000 | ORAL_TABLET | Freq: Four times a day (QID) | ORAL | Status: DC | PRN

## 2014-02-24 NOTE — Telephone Encounter (Signed)
See rx. 

## 2014-02-24 NOTE — Telephone Encounter (Signed)
Patient is requesting a new oxycodone rx °

## 2014-02-25 NOTE — Telephone Encounter (Signed)
Rx sent to front desk for pick up and left detailed message on pt's cell.

## 2014-03-25 ENCOUNTER — Telehealth: Payer: Self-pay

## 2014-03-25 NOTE — Telephone Encounter (Signed)
Pt stated that he wanted refill on Oxycodone.LDM

## 2014-03-26 ENCOUNTER — Other Ambulatory Visit: Payer: Self-pay

## 2014-03-26 DIAGNOSIS — G8929 Other chronic pain: Secondary | ICD-10-CM

## 2014-03-26 DIAGNOSIS — M542 Cervicalgia: Principal | ICD-10-CM

## 2014-03-26 MED ORDER — OXYCODONE-ACETAMINOPHEN 10-325 MG PO TABS: 1.0000 | ORAL_TABLET | Freq: Four times a day (QID) | ORAL | Status: DC | PRN

## 2014-03-26 NOTE — Telephone Encounter (Signed)
Please call and inform pt his Oxycodone RX is ready

## 2014-03-27 NOTE — Telephone Encounter (Signed)
Informed patient of this.  °

## 2014-04-22 ENCOUNTER — Telehealth: Payer: Self-pay | Admitting: Family Medicine

## 2014-04-22 DIAGNOSIS — M542 Cervicalgia: Principal | ICD-10-CM

## 2014-04-22 DIAGNOSIS — G8929 Other chronic pain: Secondary | ICD-10-CM

## 2014-04-22 MED ORDER — OXYCODONE-ACETAMINOPHEN 10-325 MG PO TABS: 1.0000 | ORAL_TABLET | Freq: Four times a day (QID) | ORAL | Status: DC | PRN

## 2014-04-22 NOTE — Telephone Encounter (Signed)
Last Rx printed 03/26/14, #100. Rx printed and forwarded to Provider for signature.

## 2014-04-22 NOTE — Telephone Encounter (Signed)
Pt is needing new rx oxyCODONE-acetaminophen (PERCOCET) 10-325 MG per tablet. Please call when available for pick up. ° °

## 2014-05-16 ENCOUNTER — Ambulatory Visit: Payer: 59 | Admitting: Family Medicine

## 2014-05-18 ENCOUNTER — Ambulatory Visit (INDEPENDENT_AMBULATORY_CARE_PROVIDER_SITE_OTHER): Payer: Self-pay | Admitting: Medical

## 2014-05-18 VITALS — BP 150/99 | HR 124 | Temp 98.8°F | Ht 68.5 in | Wt 234.4 lb

## 2014-05-18 DIAGNOSIS — G8929 Other chronic pain: Secondary | ICD-10-CM

## 2014-05-18 DIAGNOSIS — M542 Cervicalgia: Secondary | ICD-10-CM

## 2014-05-18 DIAGNOSIS — M5440 Lumbago with sciatica, unspecified side: Secondary | ICD-10-CM

## 2014-05-18 MED ORDER — KETOROLAC TROMETHAMINE 60 MG/2ML IM SOLN
60.0000 mg | Freq: Once | INTRAMUSCULAR | Status: AC
  Administered 2014-05-18: 60 mg via INTRAMUSCULAR

## 2014-05-18 MED ORDER — DICLOFENAC SODIUM 75 MG PO TBEC: 75.0000 mg | DELAYED_RELEASE_TABLET | Freq: Two times a day (BID) | ORAL | Status: DC

## 2014-05-18 MED ORDER — KETOROLAC TROMETHAMINE 60 MG/2ML IM SOLN: 60.0000 mg | Freq: Once | INTRAMUSCULAR | Status: DC

## 2014-05-18 MED ORDER — OXYCODONE-ACETAMINOPHEN 10-325 MG PO TABS: 1.0000 | ORAL_TABLET | Freq: Four times a day (QID) | ORAL | Status: DC | PRN

## 2014-05-18 NOTE — Assessment & Plan Note (Signed)
Chronic back pain and pt  take percocet on a  regular basist. Recent flare with heavy lifting. toradol im in office. Diclofenac rx. Continue flexeril and percocet. Did post date rx fof percocet refill for oct 8th since he lives in [place]. (About to run out) Work note given today. If pain persists advised would need xray of lumbar spine and maybe mri.

## 2014-05-18 NOTE — Patient Instructions (Addendum)
For your back pain, we gave you toradol 60 mg im today and prescipton diclofenac. Also continue your flexeril and percocet.  If you pain worsens with severe radiating pain,  leg weakness, bladder dysfunction or foot drop then ED evaluation.  Follow up in 3-4 wks or as needed

## 2014-05-18 NOTE — Progress Notes (Signed)
Subjective:    Patient ID: Scott Sosa, male    DOB: [birth year], 53 y.o.   MRN: [id number]  HPI  Pt states he has history of low back pain. Pain is chronic. He is on percocet regularly. Recently has been taking 1 tab every 4-5 times a day. Before flare of his pain he was taking the pain about 3 times a day. Pt is wondering if he could get prescription of diclofenac.(No current nsaid use per pt) This helped his wife a lot. Pt is also on cyclobenzprene. No history of stomach ulcer and his kidney function looks good on review.  Pt states about 10 days ago he lifted some things that were heavier than usual. Lifting heavy metal.  About 7 days now pain worse than usual. Over the weekend pain eased up some. His pain level is level 7/10 now.   Pain is lower back. Some pain radiating down his rt leg ot hamstring area. Point to rt si area as source of pain today. No loss of bladder function. No saddle anesthesia.  Advance l5-S1 space narrowing. Xray 2012.  Past Medical History  Diagnosis Date  . Allergy     seasonal  . Diabetes mellitus 7-12  . Hypertension   . Chronic lower back pain   . Chicken pox as a teenager  . Arthritis   . Hyperlipidemia   . Low back pain 06/14/2011  . Neck pain, chronic 06/14/2011  . Overweight(278.02) 06/14/2011  . HTN (hypertension) 06/14/2011  . Tobacco abuse 06/14/2011  . 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 Diabetes mellitus 06/14/2011  . Allergic state 06/14/2011  . Vitamin D deficiency   . Tinea corporis 06/20/2011  . Folliculitis 07/13/2011  . Fatigue 07/13/2011  . Anxiety and depression 08/12/2011  . Sleep apnea 09/25/2011  . Testosterone deficiency 09/25/2011  . Foot pain, left 11/21/2011  . OA (osteoarthritis) 11/30/2011    History   Social History  . Marital Status: Married    Spouse Name: N/A    Number of Children: N/A  . Years of Education: N/A   Occupational History  . Not on file.   Social History Main Topics  . Smoking status: Current Every  Day Smoker -- 0.50 packs/day for 30 years    Types: Cigarettes  . Smokeless tobacco: Never Used  . Alcohol Use: Yes     Comment: 12 pack of beer a week  . Drug Use: No  . Sexual Activity: Yes    Partners: Female   Other Topics Concern  . Not on file   Social History Narrative  . No narrative on file    No past surgical history on file.  Family History  Problem Relation Age of Onset  . Hyperlipidemia Mother   . Diabetes Mother     type 2  . Thyroid disease Mother   . Arthritis Mother     crippling  . Hyperlipidemia Father   . Cancer Father     gum/ smoked cigars  . Arthritis Sister     rheumatoid  . Other Sister     heart arrythmia  . Heart disease Sister   . Other Sister     bad back  . Arthritis Sister     chronic    Allergies  Allergen Reactions  . Wellbutrin [Bupropion Hcl]     irritable    Current Outpatient Prescriptions on File Prior to Visit  Medication Sig Dispense Refill  . lisinopril-hydrochlorothiazide (PRINZIDE,ZESTORETIC) 20-25 MG per tablet Take 1 tablet  by mouth daily.  90 tablet  0  . loratadine (CLARITIN) 10 MG tablet Take 10 mg by mouth daily.        . metFORMIN (GLUCOPHAGE) 500 MG tablet Take 500 mg by mouth 2 (two) times daily with a meal.        . metoprolol (LOPRESSOR) 50 MG tablet Take 1 tablet (50 mg total) by mouth 2 (two) times daily.  60 tablet  3  . sildenafil (VIAGRA) 100 MG tablet Take 0.5 tablets (50 mg total) by mouth daily as needed for erectile dysfunction.  6 tablet  1   No current facility-administered medications on file prior to visit.    BP 150/99  Pulse 124  Temp(Src) 98.8 F (37.1 C) (Oral)  Ht 5' 8.5" (1.74 m)  Wt 234 lb 6.4 oz (106.323 kg)  BMI 35.12 kg/m2  SpO2 96%     Review of Systems  Constitutional: Negative for fever, chills, diaphoresis and fatigue.  Respiratory: Negative for cough, chest tightness, shortness of breath and wheezing.   Cardiovascular: Negative for chest pain and palpitations.    Gastrointestinal: Negative.   Genitourinary: Negative.   Musculoskeletal: Positive for back pain.       Points to rt si area today as region of pain.  Skin: Negative.   Neurological: Negative for dizziness, seizures, syncope, speech difficulty, weakness, numbness and headaches.       Some pain from rt si area toward rt hamstring area.  Hematological: Negative for adenopathy. Does not bruise/bleed easily.  Psychiatric/Behavioral: Negative.        Objective:   Physical Exam  Constitutional: He is oriented to person, place, and time. He appears well-developed and well-nourished. No distress.  Moderate appearance of pain on movement.  Eyes: Conjunctivae and EOM are normal. Pupils are equal, round, and reactive to light.  Neck: Normal range of motion. Neck supple. No JVD present. No tracheal deviation present. No thyromegaly present.  Cardiovascular: Normal rate and regular rhythm.  Exam reveals no friction rub.   No murmur heard. Pulmonary/Chest: Effort normal and breath sounds normal. No stridor. No respiratory distress. He has no wheezes. He has no rales. He exhibits no tenderness.  Abdominal: Bowel sounds are normal. He exhibits no distension and no mass. There is no tenderness. There is no rebound and no guarding.  Musculoskeletal:  Mid lumbar tenderness to palpation directly with some rt si tenderness as well.  Pain on straight leg lift and changing positions.  Lower ext- L5- s1 sensation intact bilaterally. No foot drop. Could not asses rt patella reflex since wearing knee brace.  Lymphadenopathy:    He has no cervical adenopathy.  Neurological: He is oriented to person, place, and time.  Skin: He is not diaphoretic.  Psychiatric: He has a normal mood and affect. His behavior is normal. Judgment and thought content normal.          Assessment & Plan:

## 2014-06-16 ENCOUNTER — Other Ambulatory Visit: Payer: Self-pay

## 2014-06-16 ENCOUNTER — Telehealth: Payer: Self-pay | Admitting: Family Medicine

## 2014-06-16 DIAGNOSIS — M542 Cervicalgia: Principal | ICD-10-CM

## 2014-06-16 DIAGNOSIS — G8929 Other chronic pain: Secondary | ICD-10-CM

## 2014-06-16 MED ORDER — OXYCODONE-ACETAMINOPHEN 10-325 MG PO TABS: 1.0000 | ORAL_TABLET | Freq: Four times a day (QID) | ORAL | Status: DC | PRN

## 2014-06-16 NOTE — Telephone Encounter (Signed)
Please inform pt that this can be picked up on Friday but can't be filled until 06-19-14

## 2014-06-16 NOTE — Telephone Encounter (Signed)
Caller name:Hada Deric Relation to ZO:XWRUpt:self Call back number:[phone] Pharmacy:  Reason for call: pt is needing new rx for  oxyCODONE-acetaminophen (PERCOCET) 10-325 mg

## 2014-06-17 NOTE — Telephone Encounter (Signed)
Phone # is not in service. Will try again later.

## 2014-06-18 NOTE — Telephone Encounter (Signed)
Phone # is not in service

## 2014-07-20 ENCOUNTER — Other Ambulatory Visit: Payer: Self-pay

## 2014-07-20 DIAGNOSIS — G8929 Other chronic pain: Secondary | ICD-10-CM

## 2014-07-20 DIAGNOSIS — M542 Cervicalgia: Principal | ICD-10-CM

## 2014-07-20 DIAGNOSIS — I10 Essential (primary) hypertension: Secondary | ICD-10-CM

## 2014-07-20 MED ORDER — OXYCODONE-ACETAMINOPHEN 10-325 MG PO TABS: 1.0000 | ORAL_TABLET | Freq: Four times a day (QID) | ORAL | Status: DC | PRN

## 2014-07-20 MED ORDER — LISINOPRIL-HYDROCHLOROTHIAZIDE 20-25 MG PO TABS
1.0000 | ORAL_TABLET | Freq: Every day | ORAL | Status: DC
Start: 2014-07-20 — End: 2014-10-13

## 2014-07-20 NOTE — Progress Notes (Signed)
Phone not in service.

## 2014-07-20 NOTE — Progress Notes (Signed)
Please inform pt that RX is ready to be picked up

## 2014-07-30 ENCOUNTER — Ambulatory Visit (INDEPENDENT_AMBULATORY_CARE_PROVIDER_SITE_OTHER): Payer: Non-veteran care | Admitting: Family Medicine

## 2014-07-30 ENCOUNTER — Ambulatory Visit (INDEPENDENT_AMBULATORY_CARE_PROVIDER_SITE_OTHER): Payer: Non-veteran care | Admitting: *Deleted

## 2014-07-30 ENCOUNTER — Encounter: Payer: Self-pay | Admitting: Family Medicine

## 2014-07-30 VITALS — BP 142/96 | HR 103 | Temp 98.6°F | Wt 225.8 lb

## 2014-07-30 DIAGNOSIS — I10 Essential (primary) hypertension: Secondary | ICD-10-CM

## 2014-07-30 DIAGNOSIS — Z23 Encounter for immunization: Secondary | ICD-10-CM

## 2014-07-30 DIAGNOSIS — G8929 Other chronic pain: Secondary | ICD-10-CM

## 2014-07-30 DIAGNOSIS — E663 Overweight: Secondary | ICD-10-CM

## 2014-07-30 DIAGNOSIS — M5440 Lumbago with sciatica, unspecified side: Secondary | ICD-10-CM

## 2014-07-30 DIAGNOSIS — E11628 Type 2 diabetes mellitus with other skin complications: Secondary | ICD-10-CM

## 2014-07-30 DIAGNOSIS — F418 Other specified anxiety disorders: Secondary | ICD-10-CM

## 2014-07-30 DIAGNOSIS — F32A Depression, unspecified: Secondary | ICD-10-CM

## 2014-07-30 DIAGNOSIS — E291 Testicular hypofunction: Secondary | ICD-10-CM

## 2014-07-30 DIAGNOSIS — F329 Major depressive disorder, single episode, unspecified: Secondary | ICD-10-CM

## 2014-07-30 DIAGNOSIS — M542 Cervicalgia: Secondary | ICD-10-CM

## 2014-07-30 DIAGNOSIS — Z720 Tobacco use: Secondary | ICD-10-CM

## 2014-07-30 DIAGNOSIS — E785 Hyperlipidemia, unspecified: Secondary | ICD-10-CM

## 2014-07-30 MED ORDER — CITALOPRAM HYDROBROMIDE 20 MG PO TABS: 20.0000 mg | ORAL_TABLET | Freq: Every day | ORAL | Status: DC

## 2014-07-30 MED ORDER — OXYCODONE-ACETAMINOPHEN 10-325 MG PO TABS: 1.0000 | ORAL_TABLET | Freq: Four times a day (QID) | ORAL | Status: DC | PRN

## 2014-07-30 NOTE — Assessment & Plan Note (Signed)
Encouraged DASH diet, decrease po intake and increase exercise as tolerated. Needs 7-8 hours of sleep nightly. Avoid trans fats, eat small, frequent meals every 4-5 hours with lean proteins, complex carbs and healthy fats. Minimize simple carbs, GMO foods. 

## 2014-07-30 NOTE — Assessment & Plan Note (Addendum)
Elevated initially. Improved with recheck but forgot dose in past 24 hours. 127/84 recently at home.

## 2014-07-30 NOTE — Progress Notes (Signed)
Pre visit review using our clinic review tool, if applicable. No additional management support is needed unless otherwise documented below in the visit note. 

## 2014-07-30 NOTE — Patient Instructions (Addendum)
Citalopram start with 1/2 tab daily x 7 days then increase to 1 tab daily as tolerated  Depression Depression refers to feeling sad, low, down in the dumps, blue, gloomy, or empty. In general, there are two kinds of depression: 1. Normal sadness or normal grief. This kind of depression is one that we all feel from time to time after upsetting life experiences, such as the loss of a job or the ending of a relationship. This kind of depression is considered normal, is short lived, and resolves within a few days to 2 weeks. Depression experienced after the loss of a loved one (bereavement) often lasts longer than 2 weeks but normally gets better with time. 2. Clinical depression. This kind of depression lasts longer than normal sadness or normal grief or interferes with your ability to function at home, at work, and in school. It also interferes with your personal relationships. It affects almost every aspect of your life. Clinical depression is an illness. Symptoms of depression can also be caused by conditions other than those mentioned above, such as:  Physical illness. Some physical illnesses, including underactive thyroid gland (hypothyroidism), severe anemia, specific types of cancer, diabetes, uncontrolled seizures, heart and lung problems, strokes, and chronic pain are commonly associated with symptoms of depression.  Side effects of some prescription medicine. In some people, certain types of medicine can cause symptoms of depression.  Substance abuse. Abuse of alcohol and illicit drugs can cause symptoms of depression. SYMPTOMS Symptoms of normal sadness and normal grief include the following:  Feeling sad or crying for short periods of time.  Not caring about anything (apathy).  Difficulty sleeping or sleeping too much.  No longer able to enjoy the things you used to enjoy.  Desire to be by oneself all the time (social isolation).  Lack of energy or motivation.  Difficulty  concentrating or remembering.  Change in appetite or weight.  Restlessness or agitation. Symptoms of clinical depression include the same symptoms of normal sadness or normal grief and also the following symptoms:  Feeling sad or crying all the time.  Feelings of guilt or worthlessness.  Feelings of hopelessness or helplessness.  Thoughts of suicide or the desire to harm yourself (suicidal ideation).  Loss of touch with reality (psychotic symptoms). Seeing or hearing things that are not real (hallucinations) or having false beliefs about your life or the people around you (delusions and paranoia). DIAGNOSIS  The diagnosis of clinical depression is usually based on how bad the symptoms are and how long they have lasted. Your health care provider will also ask you questions about your medical history and substance use to find out if physical illness, use of prescription medicine, or substance abuse is causing your depression. Your health care provider may also order blood tests. TREATMENT  Often, normal sadness and normal grief do not require treatment. However, sometimes antidepressant medicine is given for bereavement to ease the depressive symptoms until they resolve. The treatment for clinical depression depends on how bad the symptoms are but often includes antidepressant medicine, counseling with a mental health professional, or both. Your health care provider will help to determine what treatment is best for you. Depression caused by physical illness usually goes away with appropriate medical treatment of the illness. If prescription medicine is causing depression, talk with your health care provider about stopping the medicine, decreasing the dose, or changing to another medicine. Depression caused by the abuse of alcohol or illicit drugs goes away when you stop using  these substances. Some adults need professional help in order to stop drinking or using drugs. SEEK IMMEDIATE MEDICAL  CARE IF:  You have thoughts about hurting yourself or others.  You lose touch with reality (have psychotic symptoms).  You are taking medicine for depression and have a serious side effect. FOR MORE INFORMATION  National Alliance on Mental Illness: www.nami.[employer]  National Institute of Mental Health: http://www.maynard.net/www.nimh.nih.gov Document Released: 07/28/2000 Document Revised: 12/15/2013 Document Reviewed: 10/30/2011 [facility] Patient Information 2015 [employer], [place]. This information is not intended to replace advice given to you by your health care provider. Make sure you discuss any questions you have with your health care provider.

## 2014-07-30 NOTE — Progress Notes (Signed)
Scott Sosa  [id number] 1961-02-26 07/30/2014      Progress Note-Follow Up  Subjective  Chief Complaint  Chief Complaint  Patient presents with  . Follow-up    6 mos    HPI  Patient is a 53 y.o. male in today for routine medical care. Patient is in today complaining of ongoing back pain. Had gotten a new job was doing well when he had a flare in his back pain. Missed work and was fired. At this point he is trying to figure out how much. He can get to [facility] and looking for a new job. He acknowledges his mood is somewhat worse as a result but he feels he is managing. Denies polyuria polydipsia or worsening illness. Denies CP/palp/SOB/HA/congestion/fevers/GI or GU c/o. Taking meds as prescribed  Past Medical History  Diagnosis Date  . Allergy     seasonal  . Diabetes mellitus 7-12  . Hypertension   . Chronic lower back pain   . Chicken pox as a teenager  . Arthritis   . Hyperlipidemia   . Low back pain 06/14/2011  . Neck pain, chronic 06/14/2011  . Overweight(278.02) 06/14/2011  . HTN (hypertension) 06/14/2011  . Tobacco abuse 06/14/2011  . 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 Diabetes mellitus 06/14/2011  . Allergic state 06/14/2011  . Vitamin D deficiency   . Tinea corporis 06/20/2011  . Folliculitis 07/13/2011  . Fatigue 07/13/2011  . Anxiety and depression 08/12/2011  . Sleep apnea 09/25/2011  . Testosterone deficiency 09/25/2011  . Foot pain, left 11/21/2011  . OA (osteoarthritis) 11/30/2011    History reviewed. No pertinent past surgical history.  Family History  Problem Relation Age of Onset  . Hyperlipidemia Mother   . Diabetes Mother     type 2  . Thyroid disease Mother   . Arthritis Mother     crippling  . Hyperlipidemia Father   . Cancer Father     gum/ smoked cigars  . Arthritis Sister     rheumatoid  . Other Sister     heart arrythmia  . Heart disease Sister   . Other Sister     bad back  . Arthritis Sister     chronic     History   Social History  . Marital Status: Married    Spouse Name: N/A    Number of Children: N/A  . Years of Education: N/A   Occupational History  . Not on file.   Social History Main Topics  . Smoking status: Current Every Day Smoker -- 0.50 packs/day for 30 years    Types: Cigarettes  . Smokeless tobacco: Never Used  . Alcohol Use: Yes     Comment: 12 pack of beer a week  . Drug Use: No  . Sexual Activity:    Partners: Female   Other Topics Concern  . Not on file   Social History Narrative    Current Outpatient Prescriptions on File Prior to Visit  Medication Sig Dispense Refill  . lisinopril-hydrochlorothiazide (PRINZIDE,ZESTORETIC) 20-25 MG per tablet Take 1 tablet by mouth daily. 90 tablet 0  . loratadine (CLARITIN) 10 MG tablet Take 10 mg by mouth daily.      . metFORMIN (GLUCOPHAGE) 500 MG tablet Take 500 mg by mouth 2 (two) times daily with a meal.      . metoprolol (LOPRESSOR) 50 MG tablet Take 1 tablet (50 mg total) by mouth 2 (two) times daily. 60 tablet 3  . oxyCODONE-acetaminophen (PERCOCET) 10-325 MG  per tablet Take 1 tablet by mouth every 6 (six) hours as needed for pain. 100 tablet 0  . diclofenac (VOLTAREN) 75 MG EC tablet Take 1 tablet (75 mg total) by mouth 2 (two) times daily. (Patient not taking: Reported on 07/30/2014) 40 tablet 0  . sildenafil (VIAGRA) 100 MG tablet Take 0.5 tablets (50 mg total) by mouth daily as needed for erectile dysfunction. (Patient not taking: Reported on 07/30/2014) 6 tablet 1   No current facility-administered medications on file prior to visit.    Allergies  Allergen Reactions  . Wellbutrin [Bupropion Hcl]     irritable    Review of Systems  Review of Systems  Constitutional: Positive for malaise/fatigue. Negative for fever.  HENT: Negative for congestion.   Eyes: Negative for discharge.  Respiratory: Negative for shortness of breath.   Cardiovascular: Negative for chest pain, palpitations and leg  swelling.  Gastrointestinal: Negative for nausea, abdominal pain and diarrhea.  Genitourinary: Negative for dysuria.  Musculoskeletal: Positive for back pain. Negative for falls.  Skin: Negative for rash.  Neurological: Negative for loss of consciousness and headaches.  Endo/Heme/Allergies: Negative for polydipsia.  Psychiatric/Behavioral: Positive for depression. Negative for suicidal ideas. The patient is nervous/anxious. The patient does not have insomnia.     Objective  BP 156/98 mmHg  Pulse 103  Temp(Src) 98.6 F (37 C) (Oral)  Wt 225 lb 12.8 oz (102.422 kg)  SpO2 96%  Physical Exam  Physical Exam  Constitutional: He is oriented to person, place, and time and well-developed, well-nourished, and in no distress. No distress.  HENT:  Head: Normocephalic and atraumatic.  Eyes: Conjunctivae are normal.  Neck: Neck supple. No thyromegaly present.  Cardiovascular: Normal rate, regular rhythm and normal heart sounds.   No murmur heard. Pulmonary/Chest: Effort normal and breath sounds normal. No respiratory distress.  Abdominal: He exhibits no distension and no mass. There is no tenderness.  Musculoskeletal: He exhibits no edema.  Neurological: He is alert and oriented to person, place, and time.  Skin: Skin is warm.  Psychiatric: Memory, affect and judgment normal.    Lab Results  Component Value Date   TSH 1.858 01/21/2014   Lab Results  Component Value Date   WBC 10.0 01/21/2014   HGB 14.6 01/21/2014   HCT 42.2 01/21/2014   MCV 91.3 01/21/2014   PLT 271 01/21/2014   Lab Results  Component Value Date   CREATININE 0.68 01/21/2014   BUN 12 01/21/2014   NA 137 01/21/2014   K 4.0 01/21/2014   CL 101 01/21/2014   CO2 26 01/21/2014   Lab Results  Component Value Date   ALT 42 01/21/2014   AST 33 01/21/2014   ALKPHOS 68 01/21/2014   BILITOT 0.4 01/21/2014   Lab Results  Component Value Date   CHOL 244* 01/21/2014   Lab Results  Component Value Date   HDL  54 01/21/2014   Lab Results  Component Value Date   LDLCALC NOT CALC 01/21/2014   Lab Results  Component Value Date   TRIG 504* 01/21/2014   Lab Results  Component Value Date   CHOLHDL 4.5 01/21/2014     Assessment & Plan  Overweight Encouraged DASH diet, decrease po intake and increase exercise as tolerated. Needs 7-8 hours of sleep nightly. Avoid trans fats, eat small, frequent meals every 4-5 hours with lean proteins, complex carbs and healthy fats. Minimize simple carbs, GMO foods.  HTN (hypertension) Elevated initially. Improved with recheck but forgot dose in past 24 hours.  127/84 recently at home.  Diabetes mellitus hgba1c acceptable, minimize simple carbs. Increase exercise as tolerated. Continue current meds  Tobacco abuse Encouraged complete cessation. Discussed need to quit as relates to risk of numerous cancers, cardiac and pulmonary disease as well as neurologic complications. Counseled for greater than 3 minutes  Low back pain Encouraged moist heat and gentle stretching as tolerated. May try NSAIDs and prescription meds as directed and report if symptoms worsen or seek immediate care. Allowed refill on meds.  Depression Agrees to try a course of Citalopram 20 mg daily, report any concerns.

## 2014-08-03 ENCOUNTER — Encounter: Payer: Self-pay | Admitting: Family Medicine

## 2014-08-03 DIAGNOSIS — F32A Depression, unspecified: Secondary | ICD-10-CM

## 2014-08-03 DIAGNOSIS — F329 Major depressive disorder, single episode, unspecified: Secondary | ICD-10-CM | POA: Insufficient documentation

## 2014-08-03 HISTORY — DX: Depression, unspecified: F32.A

## 2014-08-03 NOTE — Assessment & Plan Note (Signed)
Encouraged complete cessation. Discussed need to quit as relates to risk of numerous cancers, cardiac and pulmonary disease as well as neurologic complications. Counseled for greater than 3 minutes 

## 2014-08-03 NOTE — Assessment & Plan Note (Signed)
hgba1c acceptable, minimize simple carbs. Increase exercise as tolerated. Continue current meds 

## 2014-08-03 NOTE — Assessment & Plan Note (Signed)
Agrees to try a course of Citalopram 20 mg daily, report any concerns.

## 2014-08-03 NOTE — Assessment & Plan Note (Signed)
Encouraged moist heat and gentle stretching as tolerated. May try NSAIDs and prescription meds as directed and report if symptoms worsen or seek immediate care. Allowed refill on meds.

## 2014-09-16 ENCOUNTER — Other Ambulatory Visit: Payer: Self-pay

## 2014-09-16 DIAGNOSIS — G8929 Other chronic pain: Secondary | ICD-10-CM

## 2014-09-16 DIAGNOSIS — M542 Cervicalgia: Principal | ICD-10-CM

## 2014-09-16 MED ORDER — OXYCODONE-ACETAMINOPHEN 10-325 MG PO TABS: 1.0000 | ORAL_TABLET | Freq: Four times a day (QID) | ORAL | Status: DC | PRN

## 2014-09-16 NOTE — Telephone Encounter (Signed)
OK to refill Oxycodone, same strength, same sig, same number, i will print now and sign in am

## 2014-09-17 MED ORDER — OXYCODONE-ACETAMINOPHEN 10-325 MG PO TABS: 1.0000 | ORAL_TABLET | Freq: Four times a day (QID) | ORAL | Status: DC | PRN

## 2014-09-17 NOTE — Addendum Note (Signed)
Addended by: Verdie ShireBAYNES, ANGELA M on: 09/17/2014 03:12 PM   Modules accepted: Orders

## 2014-09-17 NOTE — Telephone Encounter (Signed)
Rx printed,signed and given to the pt.  UDS collected.//AB/CMA

## 2014-10-13 ENCOUNTER — Telehealth: Payer: Self-pay | Admitting: Family Medicine

## 2014-10-13 ENCOUNTER — Other Ambulatory Visit: Payer: Self-pay | Admitting: Family Medicine

## 2014-10-13 DIAGNOSIS — M542 Cervicalgia: Principal | ICD-10-CM

## 2014-10-13 DIAGNOSIS — I10 Essential (primary) hypertension: Secondary | ICD-10-CM

## 2014-10-13 DIAGNOSIS — G8929 Other chronic pain: Secondary | ICD-10-CM

## 2014-10-13 MED ORDER — LISINOPRIL-HYDROCHLOROTHIAZIDE 20-25 MG PO TABS: 1.0000 | ORAL_TABLET | Freq: Every day | ORAL | Status: AC

## 2014-10-13 MED ORDER — OXYCODONE-ACETAMINOPHEN 10-325 MG PO TABS: 1.0000 | ORAL_TABLET | Freq: Four times a day (QID) | ORAL | Status: DC | PRN

## 2014-10-13 NOTE — Telephone Encounter (Signed)
Caller name: Scott Sosa, Scott Sosa Relation to pt: self  Call back number: [phone] Pharmacy: [facility] [address], [place], [place] [address] [phone]    Reason for call:  Pt requesting a refill oxyCODONE-acetaminophen (PERCOCET) 10-325 MG per tablet and please send lisinopril-hydrochlorothiazide (PRINZIDE,ZESTORETIC) 20-25 MG per tablet to [facility] [place] [phone]

## 2014-10-15 NOTE — Telephone Encounter (Signed)
Called the patient informed to pickup hardcopy for Oxycodone #100 with 0 refills at the front desk.

## 2014-10-29 ENCOUNTER — Ambulatory Visit: Payer: Non-veteran care | Admitting: Family Medicine

## 2014-11-10 ENCOUNTER — Telehealth: Payer: Self-pay | Admitting: Family Medicine

## 2014-11-10 DIAGNOSIS — M542 Cervicalgia: Principal | ICD-10-CM

## 2014-11-10 DIAGNOSIS — G8929 Other chronic pain: Secondary | ICD-10-CM

## 2014-11-10 MED ORDER — OXYCODONE-ACETAMINOPHEN 10-325 MG PO TABS: 1.0000 | ORAL_TABLET | Freq: Four times a day (QID) | ORAL | Status: DC | PRN

## 2014-11-10 NOTE — Telephone Encounter (Signed)
Caller name:Haselton, Peyton NajjarLarry Relation to ZO:XWRUpt:self Call back [phone] Pharmacy:  Reason for call: pt is needing rx oxyCODONE-acetaminophen (PERCOCET) 10-325 MG per tablet . Please call when available for pick up.

## 2014-11-10 NOTE — Telephone Encounter (Signed)
Last refill 10/13/14 #100 with 0 refills Last OV 07/30/14 Next OV 12/29/14.

## 2014-11-10 NOTE — Telephone Encounter (Signed)
Called the patient left a detailed message hardcopy is ready for pickup at the front desk. 

## 2014-11-10 NOTE — Telephone Encounter (Signed)
Will grant Rx in PCP absence.  Can pick up tomorrow but cannot be filled until 11/12/14

## 2014-12-09 ENCOUNTER — Telehealth: Payer: Self-pay | Admitting: Family Medicine

## 2014-12-09 DIAGNOSIS — M542 Cervicalgia: Principal | ICD-10-CM

## 2014-12-09 DIAGNOSIS — G8929 Other chronic pain: Secondary | ICD-10-CM

## 2014-12-09 NOTE — Telephone Encounter (Signed)
Relation to pt: self Call back [phone]   Reason for call:  Pt requesting a refill oxyCODONE-acetaminophen (PERCOCET) 10-325 MG per tablet

## 2014-12-09 NOTE — Telephone Encounter (Signed)
Patient requesting Percocet refill:  Last refill 11/10/14 for 100 and 0 (q6h PRN) Last office visit 07/30/14 Contract and UDS 09/17/14- moderate risk next due 12/16/14.  Please advise.

## 2014-12-09 NOTE — Telephone Encounter (Signed)
Ok to refill med as requested

## 2014-12-10 MED ORDER — OXYCODONE-ACETAMINOPHEN 10-325 MG PO TABS: 1.0000 | ORAL_TABLET | Freq: Four times a day (QID) | ORAL | Status: DC | PRN

## 2014-12-10 NOTE — Telephone Encounter (Signed)
Rx printed and awaiting MD signature 

## 2014-12-10 NOTE — Addendum Note (Signed)
Addended by: Noreene LarssonLARSON, [place] Valli A on: 12/10/2014 08:18 AM   Modules accepted: Orders

## 2014-12-11 NOTE — Telephone Encounter (Signed)
Placed at front desk and notified patient

## 2014-12-29 ENCOUNTER — Ambulatory Visit: Payer: Non-veteran care | Admitting: Family Medicine

## 2015-01-04 ENCOUNTER — Ambulatory Visit (INDEPENDENT_AMBULATORY_CARE_PROVIDER_SITE_OTHER): Payer: Non-veteran care | Admitting: Family Medicine

## 2015-01-04 ENCOUNTER — Encounter: Payer: Self-pay | Admitting: Family Medicine

## 2015-01-04 VITALS — BP 126/78 | HR 90 | Temp 98.3°F | Ht 68.0 in | Wt 213.0 lb

## 2015-01-04 DIAGNOSIS — R000 Tachycardia, unspecified: Secondary | ICD-10-CM

## 2015-01-04 DIAGNOSIS — M25561 Pain in right knee: Secondary | ICD-10-CM

## 2015-01-04 DIAGNOSIS — I10 Essential (primary) hypertension: Secondary | ICD-10-CM | POA: Diagnosis not present

## 2015-01-04 DIAGNOSIS — E118 Type 2 diabetes mellitus with unspecified complications: Secondary | ICD-10-CM

## 2015-01-04 DIAGNOSIS — Z720 Tobacco use: Secondary | ICD-10-CM

## 2015-01-04 DIAGNOSIS — M542 Cervicalgia: Secondary | ICD-10-CM

## 2015-01-04 DIAGNOSIS — E669 Obesity, unspecified: Secondary | ICD-10-CM

## 2015-01-04 DIAGNOSIS — G8929 Other chronic pain: Secondary | ICD-10-CM

## 2015-01-04 DIAGNOSIS — E663 Overweight: Secondary | ICD-10-CM

## 2015-01-04 DIAGNOSIS — M79671 Pain in right foot: Secondary | ICD-10-CM

## 2015-01-04 MED ORDER — METHOCARBAMOL 500 MG PO TABS: 500.0000 mg | ORAL_TABLET | Freq: Two times a day (BID) | ORAL | Status: AC | PRN

## 2015-01-04 MED ORDER — OXYCODONE-ACETAMINOPHEN 10-325 MG PO TABS: 1.0000 | ORAL_TABLET | Freq: Four times a day (QID) | ORAL | Status: DC | PRN

## 2015-01-04 MED ORDER — METOPROLOL TARTRATE 50 MG PO TABS: 50.0000 mg | ORAL_TABLET | Freq: Two times a day (BID) | ORAL | Status: AC

## 2015-01-04 NOTE — Progress Notes (Signed)
Scott Sosa  [id number] [birth year] 01/04/2015      Progress Note-Follow Up  Subjective  Chief Complaint  Chief Complaint  Patient presents with  . Follow-up    HPI  Patient is a 54 y.o. male in today for routine medical care. Patient is in today for follow-up. He has not been seen in a while because he lost his job and thus lost his insurance but is now covered again. Is struggling with bilateral knee pain which is part of what made his job difficult to do. He's had no recent acute illness but acknowledges his stress and anxiety are high. Acknowledges anhedonia but denies suicidal ideation. Denies CP/palp/SOB/HA/congestion/fevers/GI or GU c/o. Taking meds as prescribed  Past Medical History  Diagnosis Date  . Allergy     seasonal  . Diabetes mellitus 7-12  . Hypertension   . Chronic lower back pain   . Chicken pox as a teenager  . Arthritis   . Hyperlipidemia   . Low back pain 06/14/2011  . Neck pain, chronic 06/14/2011  . Overweight(278.02) 06/14/2011  . HTN (hypertension) 06/14/2011  . Tobacco abuse 06/14/2011  . 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 Diabetes mellitus 06/14/2011  . Allergic state 06/14/2011  . Vitamin D deficiency   . Tinea corporis 06/20/2011  . Folliculitis 07/13/2011  . Fatigue 07/13/2011  . Anxiety and depression 08/12/2011  . Sleep apnea 09/25/2011  . Testosterone deficiency 09/25/2011  . Foot pain, left 11/21/2011  . OA (osteoarthritis) 11/30/2011  . Depression 08/03/2014    History reviewed. No pertinent past surgical history.  Family History  Problem Relation Age of Onset  . Hyperlipidemia Mother   . Diabetes Mother     type 2  . Thyroid disease Mother   . Arthritis Mother     crippling  . Hyperlipidemia Father   . Cancer Father     gum/ smoked cigars  . Arthritis Sister     rheumatoid  . Other Sister     heart arrythmia  . Heart disease Sister   . Other Sister     bad back  . Arthritis Sister     chronic    History   Social  History  . Marital Status: Married    Spouse Name: N/A  . Number of Children: N/A  . Years of Education: N/A   Occupational History  . Not on file.   Social History Main Topics  . Smoking status: Current Every Day Smoker -- 0.50 packs/day for 30 years    Types: Cigarettes  . Smokeless tobacco: Never Used  . Alcohol Use: Yes     Comment: 12 pack of beer a week  . Drug Use: No  . Sexual Activity:    Partners: Female   Other Topics Concern  . Not on file   Social History Narrative    Current Outpatient Prescriptions on File Prior to Visit  Medication Sig Dispense Refill  . lisinopril-hydrochlorothiazide (PRINZIDE,ZESTORETIC) 20-25 MG per tablet Take 1 tablet by mouth daily. 90 tablet 2  . metFORMIN (GLUCOPHAGE) 500 MG tablet Take 500 mg by mouth 2 (two) times daily with a meal.      . oxyCODONE-acetaminophen (PERCOCET) 10-325 MG per tablet Take 1 tablet by mouth every 6 (six) hours as needed for pain. 100 tablet 0  . diclofenac (VOLTAREN) 75 MG EC tablet Take 1 tablet (75 mg total) by mouth 2 (two) times daily. (Patient not taking: Reported on 07/30/2014) 40 tablet 0  . loratadine (CLARITIN) 10  MG tablet Take 10 mg by mouth daily.      . sildenafil (VIAGRA) 100 MG tablet Take 0.5 tablets (50 mg total) by mouth daily as needed for erectile dysfunction. (Patient not taking: Reported on 07/30/2014) 6 tablet 1   No current facility-administered medications on file prior to visit.    Allergies  Allergen Reactions  . Wellbutrin [Bupropion Hcl]     irritable    Review of Systems  Review of Systems  Constitutional: Positive for malaise/fatigue. Negative for fever.  HENT: Negative for congestion.   Eyes: Negative for discharge.  Respiratory: Negative for shortness of breath.   Cardiovascular: Negative for chest pain, palpitations and leg swelling.  Gastrointestinal: Negative for nausea, abdominal pain and diarrhea.  Genitourinary: Negative for dysuria.  Musculoskeletal:  Positive for back pain and joint pain. Negative for falls.       Right knee pain has restricted his activity and he has lost a job recently due to unable to do the strenuous work  Skin: Negative for rash.  Neurological: Negative for loss of consciousness and headaches.  Endo/Heme/Allergies: Negative for polydipsia.  Psychiatric/Behavioral: Negative for depression and suicidal ideas. The patient is nervous/anxious. The patient does not have insomnia.     Objective  BP 134/90 mmHg  Pulse 110  Temp(Src) 98.3 F (36.8 C) (Oral)  Ht  (1.727 m)  Wt 213 lb (96.616 kg)  BMI 32.39 kg/m2  SpO2 99%  Physical Exam  Physical Exam  Constitutional: He is oriented to person, place, and time and well-developed, well-nourished, and in no distress. No distress.  HENT:  Head: Normocephalic and atraumatic.  Eyes: Conjunctivae are normal.  Neck: Neck supple. No thyromegaly present.  Cardiovascular: Normal rate, regular rhythm and normal heart sounds.   No murmur heard. Pulmonary/Chest: Effort normal and breath sounds normal. No respiratory distress.  Abdominal: He exhibits no distension and no mass. There is no tenderness.  Musculoskeletal: He exhibits no edema.  Neurological: He is alert and oriented to person, place, and time.  Skin: Skin is warm.  Psychiatric: Memory, affect and judgment normal.    Lab Results  Component Value Date   TSH 1.858 01/21/2014   Lab Results  Component Value Date   WBC 10.0 01/21/2014   HGB 14.6 01/21/2014   HCT 42.2 01/21/2014   MCV 91.3 01/21/2014   PLT 271 01/21/2014   Lab Results  Component Value Date   CREATININE 0.68 01/21/2014   BUN 12 01/21/2014   NA 137 01/21/2014   K 4.0 01/21/2014   CL 101 01/21/2014   CO2 26 01/21/2014   Lab Results  Component Value Date   ALT 42 01/21/2014   AST 33 01/21/2014   ALKPHOS 68 01/21/2014   BILITOT 0.4 01/21/2014   Lab Results  Component Value Date   CHOL 244* 01/21/2014   Lab Results    Component Value Date   HDL 54 01/21/2014   Lab Results  Component Value Date   LDLCALC NOT CALC 01/21/2014   Lab Results  Component Value Date   TRIG 504* 01/21/2014   Lab Results  Component Value Date   CHOLHDL 4.5 01/21/2014     Assessment & Plan  HTN (hypertension) Well controlled, no changes to meds. Encouraged heart healthy diet such as the DASH diet and exercise as tolerated.    Diabetes mellitus hgba1c acceptable, minimize simple carbs. Increase exercise as tolerated. Continue current meds   Right knee pain Worsening over 5 years. Has tried GC and fish  oil without relief. Stairs are most uncomfortable. Just lost a job due to significant pain with stairs. Is allowed a prescription of Percocet and Robaxin to see if that helps. Encouraged to continue wear brace, consider icing   Tobacco abuse Encouraged complete cessation. Discussed need to quit as relates to risk of numerous cancers, cardiac and pulmonary disease as well as neurologic complications. Counseled for greater than 3 minutes   Overweight Encouraged DASH diet, decrease po intake and increase exercise as tolerated. Needs 7-8 hours of sleep nightly. Avoid trans fats, eat small, frequent meals every 4-5 hours with lean proteins, complex carbs and healthy fats. Minimize simple carbs, GMO foods.   Chronic pain in right foot Follows with Dr Ulice Brilliant podiatry and has responded to steroid shots

## 2015-01-04 NOTE — Assessment & Plan Note (Signed)
Well controlled, no changes to meds. Encouraged heart healthy diet such as the DASH diet and exercise as tolerated.  °

## 2015-01-04 NOTE — Assessment & Plan Note (Signed)
Encouraged complete cessation. Discussed need to quit as relates to risk of numerous cancers, cardiac and pulmonary disease as well as neurologic complications. Counseled for greater than 3 minutes 

## 2015-01-04 NOTE — Patient Instructions (Addendum)
Rel of [facility]   Knee Pain The knee is the complex joint between your thigh and your lower leg. It is made up of bones, tendons, ligaments, and cartilage. The bones that make up the knee are:  The femur in the thigh.  The tibia and fibula in the lower leg.  The patella or kneecap riding in the groove on the lower femur. CAUSES  Knee pain is a common complaint with many causes. A few of these causes are:  Injury, such as:  A ruptured ligament or tendon injury.  Torn cartilage.  Medical conditions, such as:  Gout  Arthritis  Infections  Overuse, over training, or overdoing a physical activity. Knee pain can be minor or severe. Knee pain can accompany debilitating injury. Minor knee problems often respond well to self-care measures or get well on their own. More serious injuries may need medical intervention or even surgery. SYMPTOMS The knee is complex. Symptoms of knee problems can vary widely. Some of the problems are:  Pain with movement and weight bearing.  Swelling and tenderness.  Buckling of the knee.  Inability to straighten or extend your knee.  Your knee locks and you cannot straighten it.  Warmth and redness with pain and fever.  Deformity or dislocation of the kneecap. DIAGNOSIS  Determining what is wrong may be very straight forward such as when there is an injury. It can also be challenging because of the complexity of the knee. Tests to make a diagnosis may include:  Your caregiver taking a history and doing a physical exam.  Routine X-rays can be used to rule out other problems. X-rays will not reveal a cartilage tear. Some injuries of the knee can be diagnosed by:  Arthroscopy a surgical technique by which a small video camera is inserted through tiny incisions on the sides of the knee. This procedure is used to examine and repair internal knee joint problems. Tiny instruments can be used during arthroscopy to repair the torn knee  cartilage (meniscus).  Arthrography is a radiology technique. A contrast liquid is directly injected into the knee joint. Internal structures of the knee joint then become visible on X-ray film.  An MRI scan is a non X-ray radiology procedure in which magnetic fields and a computer produce two- or three-dimensional images of the inside of the knee. Cartilage tears are often visible using an MRI scanner. MRI scans have largely replaced arthrography in diagnosing cartilage tears of the knee.  Blood work.  Examination of the fluid that helps to lubricate the knee joint (synovial fluid). This is done by taking a sample out using a needle and a syringe. TREATMENT The treatment of knee problems depends on the cause. Some of these treatments are:  Depending on the injury, proper casting, splinting, surgery, or physical therapy care will be needed.  Give yourself adequate recovery time. Do not overuse your joints. If you begin to get sore during workout routines, back off. Slow down or do fewer repetitions.  For repetitive activities such as cycling or running, maintain your strength and nutrition.  Alternate muscle groups. For example, if you are a weight lifter, work the upper body on one day and the lower body the next.  Either tight or weak muscles do not give the proper support for your knee. Tight or weak muscles do not absorb the stress placed on the knee joint. Keep the muscles surrounding the knee strong.  Take care of mechanical problems.  If you have  flat feet, orthotics or special shoes may help. See your caregiver if you need help.  Arch supports, sometimes with wedges on the inner or outer aspect of the heel, can help. These can shift pressure away from the side of the knee most bothered by osteoarthritis.  A brace called an "unloader" brace also may be used to help ease the pressure on the most arthritic side of the knee.  If your caregiver has prescribed crutches, braces, wraps  or ice, use as directed. The acronym for this is PRICE. This means protection, rest, ice, compression, and elevation.  Nonsteroidal anti-inflammatory drugs (NSAIDs), can help relieve pain. But if taken immediately after an injury, they may actually increase swelling. Take NSAIDs with food in your stomach. Stop them if you develop stomach problems. Do not take these if you have a history of ulcers, stomach pain, or bleeding from the bowel. Do not take without your caregiver's approval if you have problems with fluid retention, heart failure, or kidney problems.  For ongoing knee problems, physical therapy may be helpful.  Glucosamine and chondroitin are over-the-counter dietary supplements. Both may help relieve the pain of osteoarthritis in the knee. These medicines are different from the usual anti-inflammatory drugs. Glucosamine may decrease the rate of cartilage destruction.  Injections of a corticosteroid drug into your knee joint may help reduce the symptoms of an arthritis flare-up. They may provide pain relief that lasts a few months. You may have to wait a few months between injections. The injections do have a small increased risk of infection, water retention, and elevated blood sugar levels.  Hyaluronic acid injected into damaged joints may ease pain and provide lubrication. These injections may work by reducing inflammation. A series of shots may give relief for as long as 6 months.  Topical painkillers. Applying certain ointments to your skin may help relieve the pain and stiffness of osteoarthritis. Ask your pharmacist for suggestions. Many over the-counter products are approved for temporary relief of arthritis pain.  In some countries, doctors often prescribe topical NSAIDs for relief of chronic conditions such as arthritis and tendinitis. A review of treatment with NSAID creams found that they worked as well as oral medications but without the serious side  effects. PREVENTION  Maintain a healthy weight. Extra pounds put more strain on your joints.  Get strong, stay limber. Weak muscles are a common cause of knee injuries. Stretching is important. Include flexibility exercises in your workouts.  Be smart about exercise. If you have osteoarthritis, chronic knee pain or recurring injuries, you may need to change the way you exercise. This does not mean you have to stop being active. If your knees ache after jogging or playing basketball, consider switching to swimming, water aerobics, or other low-impact activities, at least for a few days a week. Sometimes limiting high-impact activities will provide relief.  Make sure your shoes fit well. Choose footwear that is right for your sport.  Protect your knees. Use the proper gear for knee-sensitive activities. Use kneepads when playing volleyball or laying carpet. Buckle your seat belt every time you drive. Most shattered kneecaps occur in car accidents.  Rest when you are tired. SEEK MEDICAL CARE IF:  You have knee pain that is continual and does not seem to be getting better.  SEEK IMMEDIATE MEDICAL CARE IF:  Your knee joint feels hot to the touch and you have a high fever. MAKE SURE YOU:   Understand these instructions.  Will watch your condition.  Will  get help right away if you are not doing well or get worse. Document Released: 05/28/2007 Document Revised: 10/23/2011 Document Reviewed: 05/28/2007 [facility] Patient Information 2015 [place], [place]. This information is not intended to replace advice given to you by your health care provider. Make sure you discuss any questions you have with your health care provider. Basic Carbohydrate Counting for Diabetes Mellitus Carbohydrate counting is a method for keeping track of the amount of carbohydrates you eat. Eating carbohydrates naturally increases the level of sugar (glucose) in your blood, so it is important for you to know the amount that is  okay for you to have in every meal. Carbohydrate counting helps keep the level of glucose in your blood within normal limits. The amount of carbohydrates allowed is different for every person. A dietitian can help you calculate the amount that is right for you. Once you know the amount of carbohydrates you can have, you can count the carbohydrates in the foods you want to eat. Carbohydrates are found in the following foods:  Grains, such as breads and cereals.  Dried beans and soy products.  Starchy vegetables, such as potatoes, peas, and corn.  Fruit and fruit juices.  Milk and yogurt.  Sweets and snack foods, such as cake, cookies, candy, chips, soft drinks, and fruit drinks. CARBOHYDRATE COUNTING There are two ways to count the carbohydrates in your food. You can use either of the methods or a combination of both. Reading the "Nutrition Facts" on Packaged Food The "Nutrition Facts" is an area that is included on the labels of almost all packaged food and beverages in the [place]. It includes the serving size of that food or beverage and information about the nutrients in each serving of the food, including the grams (g) of carbohydrate per serving.  Decide the number of servings of this food or beverage that you will be able to eat or drink. Multiply that number of servings by the number of grams of carbohydrate that is listed on the label for that serving. The total will be the amount of carbohydrates you will be having when you eat or drink this food or beverage. Learning Standard Serving Sizes of Food When you eat food that is not packaged or does not include "Nutrition Facts" on the label, you need to measure the servings in order to count the amount of carbohydrates.A serving of most carbohydrate-rich foods contains about 15 g of carbohydrates. The following list includes serving sizes of carbohydrate-rich foods that provide 15 g ofcarbohydrate per serving:   1 slice of bread  (1 oz) or 1 six-inch tortilla.    of a hamburger bun or English muffin.  4-6 crackers.   cup unsweetened dry cereal.    cup hot cereal.   cup rice or pasta.    cup mashed potatoes or  of a large baked potato.  1 cup fresh fruit or one small piece of fruit.    cup canned or frozen fruit or fruit juice.  1 cup milk.   cup plain fat-free yogurt or yogurt sweetened with artificial sweeteners.   cup cooked dried beans or starchy vegetable, such as peas, corn, or potatoes.  Decide the number of standard-size servings that you will eat. Multiply that number of servings by 15 (the grams of carbohydrates in that serving). For example, if you eat 2 cups of strawberries, you will have eaten 2 servings and 30 g of carbohydrates (2 servings x 15 g = 30 g). For foods such  as soups and casseroles, in which more than one food is mixed in, you will need to count the carbohydrates in each food that is included. EXAMPLE OF CARBOHYDRATE COUNTING Sample Dinner  3 oz chicken breast.   cup of brown rice.   cup of corn.  1 cup milk.   1 cup strawberries with sugar-free whipped topping.  Carbohydrate Calculation Step 1: Identify the foods that contain carbohydrates:   Rice.   Corn.   Milk.   Strawberries. Step 2:Calculate the number of servings eaten of each:   2 servings of rice.   1 serving of corn.   1 serving of milk.   1 serving of strawberries. Step 3: Multiply each of those number of servings by 15 g:   2 servings of rice x 15 g = 30 g.   1 serving of corn x 15 g = 15 g.   1 serving of milk x 15 g = 15 g.   1 serving of strawberries x 15 g = 15 g. Step 4: Add together all of the amounts to find the total grams of carbohydrates eaten: 30 g + 15 g + 15 g + 15 g = 75 g. Document Released: 07/31/2005 Document Revised: 12/15/2013 Document Reviewed: 06/27/2013 [facility] Patient Information 2015 [place], [place]. This information is not intended to  replace advice given to you by your health care provider. Make sure you discuss any questions you have with your health care provider.

## 2015-01-04 NOTE — Assessment & Plan Note (Signed)
Worsening over 5 years. Has tried GC and fish oil without relief. Stairs are most uncomfortable. Just lost a job due to significant pain with stairs. Is allowed a prescription of Percocet and Robaxin to see if that helps. Encouraged to continue wear brace, consider icing

## 2015-01-04 NOTE — Assessment & Plan Note (Signed)
Encouraged DASH diet, decrease po intake and increase exercise as tolerated. Needs 7-8 hours of sleep nightly. Avoid trans fats, eat small, frequent meals every 4-5 hours with lean proteins, complex carbs and healthy fats. Minimize simple carbs, GMO foods. 

## 2015-01-04 NOTE — Assessment & Plan Note (Signed)
hgba1c acceptable, minimize simple carbs. Increase exercise as tolerated. Continue current meds 

## 2015-01-04 NOTE — Progress Notes (Signed)
Pre visit review using our clinic review tool, if applicable. No additional management support is needed unless otherwise documented below in the visit note. 

## 2015-01-04 NOTE — Assessment & Plan Note (Signed)
Follows with Dr Ulice Brilliantrake podiatry and has responded to steroid shots

## 2015-01-21 ENCOUNTER — Other Ambulatory Visit: Payer: Self-pay | Admitting: Family Medicine

## 2015-01-21 DIAGNOSIS — M542 Cervicalgia: Principal | ICD-10-CM

## 2015-01-21 DIAGNOSIS — G8929 Other chronic pain: Secondary | ICD-10-CM

## 2015-01-21 MED ORDER — OXYCODONE-ACETAMINOPHEN 10-325 MG PO TABS: 1.0000 | ORAL_TABLET | Freq: Four times a day (QID) | ORAL | Status: DC | PRN

## 2015-01-21 NOTE — Telephone Encounter (Signed)
Caller name: Savien Relation to pt: self Call back number: [phone] Pharmacy:  Reason for call: Pt came in office requesting rx for oxyCODONE-acetaminophen (PERCOCET) 10-325 MG per tablet, pt wants to know if he can have rx printed out for tomorrow since he is going out of town and is needing the rx in hand before leaving. Please advise.

## 2015-01-21 NOTE — Telephone Encounter (Signed)
Requesting:  OXYCODONE Contract  09/18/14 UDS  GIVEN ON 09/18/14--  MODERATE RISK Last OV  01/04/15 Last Refill   01/04/15   #100 WITH 0 REFILLS NEXT OV  04/06/15  Please Advise

## 2015-01-21 NOTE — Telephone Encounter (Signed)
No due til 6/23

## 2015-01-21 NOTE — Telephone Encounter (Signed)
Printed and called the patient to pickup at his convenience.

## 2015-01-21 NOTE — Telephone Encounter (Signed)
Called the patient and he asked if the prescription can be printed and fill date printed on script to fill 02/04/15.  He will not be out of town at that time and unable to pickup when due.  He stated he is not asking for an early refill, just the hardcopy (with fill date printed on script 02/04/15) as will be unable to pickup when due as will be working out of town.

## 2015-03-03 ENCOUNTER — Telehealth: Payer: Self-pay | Admitting: Family Medicine

## 2015-03-03 DIAGNOSIS — G8929 Other chronic pain: Secondary | ICD-10-CM

## 2015-03-03 DIAGNOSIS — M542 Cervicalgia: Principal | ICD-10-CM

## 2015-03-03 MED ORDER — OXYCODONE-ACETAMINOPHEN 10-325 MG PO TABS: 1.0000 | ORAL_TABLET | Freq: Four times a day (QID) | ORAL | Status: AC | PRN

## 2015-03-03 NOTE — Telephone Encounter (Signed)
Requesting:OXYCODONE Contract SIGNED ON 09/18/14 UDS  GIVEN ON 09/18/14 MODERATE Last OV  01/04/15 Last Refill  #100 WITH 0 REFILLS ON 01/21/15  Please Advise

## 2015-03-03 NOTE — Telephone Encounter (Signed)
Relation to pt: self  Call back number: [phone]   Reason for call:  Patient requesting oxyCODONE-acetaminophen (PERCOCET) 10-325 MG per tablet

## 2015-03-03 NOTE — Telephone Encounter (Signed)
Can have a refill with UDSr

## 2015-03-03 NOTE — Telephone Encounter (Signed)
Called the patient left msg. To call back. Printed prescription and on counter for signature.  Made note on hardcopy needs UDS

## 2015-03-03 NOTE — Telephone Encounter (Signed)
The patient did call back.  I did inform him the refill had been approved by PCP, but she would be out of the office until 03/04/15 to sign script.  The patient was instructed to wait until Thursday 03/04/15 to pickup hardcopy and to also before getting hardcopy he would need to do a UDS per PCP instructions.  The patient did agree to all.

## 2015-03-24 ENCOUNTER — Telehealth: Payer: Self-pay | Admitting: Family Medicine

## 2015-03-24 NOTE — Telephone Encounter (Signed)
Caller name: Victorino Dike at [facility] Relationship to patient: Can be reached: [phone] Pharmacy:  Reason for call: returning your call

## 2015-03-25 NOTE — Telephone Encounter (Signed)
Called left message to call back 

## 2015-03-25 NOTE — Telephone Encounter (Signed)
The patient returned the call and did inform him of PCP instructions regarding controlled medication refills due to results of [facility] results.  The patient stated he would discuss with PCP at his next OV.

## 2015-03-25 NOTE — Telephone Encounter (Signed)
[facility] Victorino Dike) stated the test is very specific and very unlikely this is a false positive.

## 2015-03-25 NOTE — Telephone Encounter (Signed)
No longer able to receive controlled substances due to breach of contract please notify patient

## 2015-03-26 NOTE — Telephone Encounter (Signed)
Addendum to note. [facility] report from 03/10/15 Patient tested positive for Cocaine. Patient was informed as below per PCP instructions no longer would fill controlled medications due to breach of contract.  The patient stated would discuss further with PCP at his next OV. Test results put in folder for [place]. To give to appropriate person to be documented in the patients chart.

## 2015-04-06 ENCOUNTER — Ambulatory Visit: Payer: Non-veteran care | Admitting: Family Medicine

## 2015-04-13 ENCOUNTER — Ambulatory Visit: Payer: Non-veteran care | Admitting: Family Medicine

## 2015-04-13 ENCOUNTER — Telehealth: Payer: Self-pay | Admitting: Family Medicine

## 2015-04-13 DIAGNOSIS — E119 Type 2 diabetes mellitus without complications: Secondary | ICD-10-CM

## 2015-04-13 DIAGNOSIS — E782 Mixed hyperlipidemia: Secondary | ICD-10-CM

## 2015-04-14 ENCOUNTER — Emergency Department (HOSPITAL_COMMUNITY): Payer: Non-veteran care

## 2015-04-14 ENCOUNTER — Encounter (HOSPITAL_COMMUNITY): Payer: Self-pay

## 2015-04-14 ENCOUNTER — Emergency Department (HOSPITAL_COMMUNITY)
Admission: EM | Admit: 2015-04-14 | Discharge: 2015-04-14 | Disposition: A | Discharge status: Home / Self Care | Payer: Non-veteran care | Attending: Emergency Medicine | Admitting: Emergency Medicine

## 2015-04-14 DIAGNOSIS — G8929 Other chronic pain: Secondary | ICD-10-CM | POA: Insufficient documentation

## 2015-04-14 DIAGNOSIS — M1711 Unilateral primary osteoarthritis, right knee: Secondary | ICD-10-CM | POA: Insufficient documentation

## 2015-04-14 DIAGNOSIS — Z720 Tobacco use: Secondary | ICD-10-CM | POA: Insufficient documentation

## 2015-04-14 DIAGNOSIS — Z872 Personal history of diseases of the skin and subcutaneous tissue: Secondary | ICD-10-CM | POA: Diagnosis not present

## 2015-04-14 DIAGNOSIS — Z8619 Personal history of other infectious and parasitic diseases: Secondary | ICD-10-CM | POA: Insufficient documentation

## 2015-04-14 DIAGNOSIS — J449 Chronic obstructive pulmonary disease, unspecified: Secondary | ICD-10-CM | POA: Insufficient documentation

## 2015-04-14 DIAGNOSIS — M549 Dorsalgia, unspecified: Secondary | ICD-10-CM | POA: Insufficient documentation

## 2015-04-14 DIAGNOSIS — Z79899 Other long term (current) drug therapy: Secondary | ICD-10-CM | POA: Diagnosis not present

## 2015-04-14 DIAGNOSIS — E119 Type 2 diabetes mellitus without complications: Secondary | ICD-10-CM | POA: Diagnosis not present

## 2015-04-14 DIAGNOSIS — I10 Essential (primary) hypertension: Secondary | ICD-10-CM | POA: Diagnosis not present

## 2015-04-14 DIAGNOSIS — Z8669 Personal history of other diseases of the nervous system and sense organs: Secondary | ICD-10-CM | POA: Insufficient documentation

## 2015-04-14 DIAGNOSIS — M25561 Pain in right knee: Secondary | ICD-10-CM | POA: Diagnosis present

## 2015-04-14 DIAGNOSIS — E663 Overweight: Secondary | ICD-10-CM | POA: Diagnosis not present

## 2015-04-14 DIAGNOSIS — F419 Anxiety disorder, unspecified: Secondary | ICD-10-CM | POA: Diagnosis not present

## 2015-04-14 LAB — CBG MONITORING, ED: Glucose-Capillary: 140 mg/dL — ABNORMAL HIGH (ref 65–99)

## 2015-04-14 MED ORDER — PREDNISONE 50 MG PO TABS
60.0000 mg | ORAL_TABLET | Freq: Once | ORAL | Status: AC
  Administered 2015-04-14: 60 mg via ORAL
  Filled 2015-04-14 (×2): qty 1

## 2015-04-14 MED ORDER — KETOROLAC TROMETHAMINE 10 MG PO TABS
10.0000 mg | ORAL_TABLET | Freq: Once | ORAL | Status: AC
  Administered 2015-04-14: 10 mg via ORAL
  Filled 2015-04-14: qty 1

## 2015-04-14 MED ORDER — IBUPROFEN 800 MG PO TABS: 800.0000 mg | ORAL_TABLET | Freq: Three times a day (TID) | ORAL | Status: AC

## 2015-04-14 MED ORDER — DEXAMETHASONE 4 MG PO TABS: 4.0000 mg | ORAL_TABLET | Freq: Two times a day (BID) | ORAL | Status: AC

## 2015-04-14 NOTE — Discharge Instructions (Signed)
Your x-ray reveals degenerative changes involving your knee, and a very small joint effusion. It is important that you see Dr. Romeo Apple, or the orthopedic specialist of your choice as soon as possible for orthopedic evaluation of this problem. Please use ibuprofen 3 times daily, and Decadron 2 times daily with food. Arthritis, Nonspecific Arthritis is pain, redness, warmth, or puffiness (inflammation) of a joint. The joint may be stiff or hurt when you move it. One or more joints may be affected. There are many types of arthritis. Your doctor may not know what type you have right away. The most common cause of arthritis is wear and tear on the joint (osteoarthritis). HOME CARE   Only take medicine as told by your doctor.  Rest the joint as much as possible.  Raise (elevate) your joint if it is puffy.  Use crutches if the painful joint is in your leg.  Drink enough fluids to keep your pee (urine) clear or pale yellow.  Follow your doctor's diet instructions.  Use cold packs for very bad joint pain for 10 to 15 minutes every hour. Ask your doctor if it is okay for you to use hot packs.  Exercise as told by your doctor.  Take a warm shower if you have stiffness in the morning.  Move your sore joints throughout the day. GET HELP RIGHT AWAY IF:   You have a fever.  You have very bad joint pain, puffiness, or redness.  You have many joints that are painful and puffy.  You are not getting better with treatment.  You have very bad back pain or leg weakness.  You cannot control when you poop (bowel movement) or pee (urinate).  You do not feel better in 24 hours or are getting worse.  You are having side effects from your medicine. MAKE SURE YOU:   Understand these instructions.  Will watch your condition.  Will get help right away if you are not doing well or get worse. Document Released: 10/25/2009 Document Revised: 01/30/2012 Document Reviewed: 10/25/2009 [facility] Patient  Information 2015 [place], [place]. This information is not intended to replace advice given to you by your health care provider. Make sure you discuss any questions you have with your health care provider.

## 2015-04-14 NOTE — ED Notes (Signed)
Pt reports chronic pain in r knee.  Reports pain flared up 2 days ago without any injury.

## 2015-04-14 NOTE — ED Notes (Signed)
Pt says he feels something "pop" in r knee when walking and usually does not feel that.

## 2015-04-14 NOTE — ED Provider Notes (Signed)
CSN: [id number]     Arrival date & time 04/14/15  4098 History   First MD Initiated Contact with Patient 04/14/15 1048     Chief Complaint  Patient presents with  . Knee Pain     (Consider location/radiation/quality/duration/timing/severity/associated sxs/prior Treatment) HPI Comments: Patient is a 54 year old male who presents to the emergency department with a complaint of right knee pain. The patient states he has had problems with his right knee in the past. He wears a splint on the knee frequently, but in the last 2 days he states that it has gotten particularly worse. The patient has not had any known injury. He's not had any recent procedures on the knee. He wanted to have it evaluated today to see if something "bad" was happening. He has tried [employer], but states these only partially helped the discomfort.  Patient is a 54 y.o. male presenting with knee pain. The history is provided by the patient.  Knee Pain Location:  Knee Associated symptoms: back pain     Past Medical History  Diagnosis Date  . Allergy     seasonal  . Diabetes mellitus 7-12  . Hypertension   . Chronic lower back pain   . Chicken pox as a teenager  . Arthritis   . Hyperlipidemia   . Low back pain 06/14/2011  . Neck pain, chronic 06/14/2011  . Overweight(278.02) 06/14/2011  . HTN (hypertension) 06/14/2011  . Tobacco abuse 06/14/2011  . 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 Diabetes mellitus 06/14/2011  . Allergic state 06/14/2011  . Vitamin D deficiency   . Tinea corporis 06/20/2011  . Folliculitis 07/13/2011  . Fatigue 07/13/2011  . Anxiety and depression 08/12/2011  . Sleep apnea 09/25/2011  . Testosterone deficiency 09/25/2011  . Foot pain, left 11/21/2011  . OA (osteoarthritis) 11/30/2011  . Depression 08/03/2014  . Chronic pain in right foot 03/29/2012    Sees Dr Ulice Brilliant podiatry in [place]    History reviewed. No pertinent past surgical history. Family History  Problem Relation Age of Onset  .  Hyperlipidemia Mother   . Diabetes Mother     type 2  . Thyroid disease Mother   . Arthritis Mother     crippling  . Hyperlipidemia Father   . Cancer Father     gum/ smoked cigars  . Arthritis Sister     rheumatoid  . Other Sister     heart arrythmia  . Heart disease Sister   . Other Sister     bad back  . Arthritis Sister     chronic   Social History  Substance Use Topics  . Smoking status: Current Every Day Smoker -- 0.50 packs/day for 30 years    Types: Cigarettes  . Smokeless tobacco: Never Used  . Alcohol Use: Yes     Comment: 12 pack of beer a week    Review of Systems  Musculoskeletal: Positive for back pain and arthralgias.  Psychiatric/Behavioral: The patient is nervous/anxious.   All other systems reviewed and are negative.     Allergies  Wellbutrin  Home Medications   Prior to Admission medications   Medication Sig Start Date End Date Taking? Authorizing Provider  Aspirin-Acetaminophen-Caffeine (GOODYS EXTRA STRENGTH) 260-130-16 MG TABS Take 1 packet by mouth daily.   Yes Historical Provider, MD  lisinopril-hydrochlorothiazide (PRINZIDE,ZESTORETIC) 20-25 MG per tablet Take 1 tablet by mouth daily. 10/13/14  Yes Bradd Canary, MD  metoprolol (LOPRESSOR) 50 MG tablet Take 1 tablet (50 mg total) by mouth 2 (  two) times daily. 01/04/15  Yes Bradd Canary, MD  methocarbamol (ROBAXIN) 500 MG tablet Take 1 tablet (500 mg total) by mouth 2 (two) times daily as needed for muscle spasms (for pain). Patient not taking: Reported on 04/14/2015 01/04/15   Bradd Canary, MD  oxyCODONE-acetaminophen (PERCOCET) 10-325 MG per tablet Take 1 tablet by mouth every 6 (six) hours as needed for pain. Patient not taking: Reported on 04/14/2015 03/03/15 03/02/16  Bradd Canary, MD   BP 162/113 mmHg  Pulse 107  Temp(Src) 98.2 F (36.8 C) (Oral)  Resp 20  Ht  (1.753 m)  Wt 200 lb (90.719 kg)  BMI 29.52 kg/m2  SpO2 99% Physical Exam  Constitutional: He is oriented to  person, place, and time. He appears well-developed and well-nourished.  Non-toxic appearance.  HENT:  Head: Normocephalic.  Right Ear: Tympanic membrane and external ear normal.  Left Ear: Tympanic membrane and external ear normal.  Eyes: EOM and lids are normal. Pupils are equal, round, and reactive to light.  Neck: Normal range of motion. Neck supple. Carotid bruit is not present.  Cardiovascular: Normal rate, regular rhythm, normal heart sounds, intact distal pulses and normal pulses.   Pulmonary/Chest: Breath sounds normal. No respiratory distress.  Abdominal: Soft. Bowel sounds are normal. There is no tenderness. There is no guarding.  Musculoskeletal: Normal range of motion.  There is good range of motion of the right hip. There is decreased range of motion of the right knee. There is no deformity of the quadricep area, there is no deformity of the anterior tibial tuberosity area. Some crepitus of the right knee with attempted range of motion. There is no mass or effusion appreciated. There is no posterior mass appreciated. The patella is in the midline. The joint is not hot. The Achilles tendon is intact. The dorsalis pedis pulses 2+.  Lymphadenopathy:       Head (right side): No submandibular adenopathy present.       Head (left side): No submandibular adenopathy present.    He has no cervical adenopathy.  Neurological: He is alert and oriented to person, place, and time. He has normal strength. No cranial nerve deficit or sensory deficit.  Skin: Skin is warm and dry.  Psychiatric: He has a normal mood and affect. His speech is normal.  Nursing note and vitals reviewed.   ED Course  Procedures (including critical care time) Labs Review Labs Reviewed  CBG MONITORING, ED - Abnormal; Notable for the following:    Glucose-Capillary 140 (*)    All other components within normal limits    Imaging Review Dg Knee Complete 4 Views Right  04/14/2015   CLINICAL DATA:  Pain and  limitation of motion for 4 days. No recent trauma.  EXAM: RIGHT KNEE - COMPLETE 4+ VIEW  COMPARISON:  February 20, 2013  FINDINGS: Frontal, lateral, and bilateral oblique views were obtained. There is no fracture or dislocation. There is a small joint effusion. There is slight spurring in all compartments. There is slight narrowing of the patellofemoral joint. No erosive change.  IMPRESSION: Small joint effusion. No fracture or dislocation. Relatively mild generalized osteoarthritic change.   Electronically Signed   By: Bretta Bang III M.D.   On: 04/14/2015 10:43   I have personally reviewed and evaluated these images and lab results as part of my medical decision-making.   EKG Interpretation None      MDM  Vital signs reviewed. The blood pressure is elevated at 162/113.  Review of the x-ray reveals a small joint effusion, and degenerative joint disease changes. No fracture or dislocation noted.  The patient has a splint. Crutches were offered, but the patient states that he will use a cane if he needs to,. I've encouraged the patient to see his primary physician, and or Dr. Romeo Apple for orthopedic evaluation concerning his knee pain. The patient knowledge is understanding of the discharge instructions.    Final diagnoses:  None    **I have reviewed nursing notes, vital signs, and all appropriate lab and imaging results for this patient.Ivery Quale, PA-C 04/14/15 1229  Geoffery Lyons, MD 04/14/15 [phone]

## 2015-04-16 NOTE — Telephone Encounter (Signed)
Yes have him do hgba1c, lipid and cmp now for diabetes 2 obese, hyperlipidemia, mixed. No charge

## 2015-04-16 NOTE — Telephone Encounter (Signed)
Pt was no show 04/13/15 2:00pm, 3 month follow up appt, I called pt and he said that he has a government issued phone and had no minutes left and wasn't able to call and notify us he could not make appt. Pt next appt for cpe is 07/06/15. Pt is diabetic. The next spot I can put him in is mid October. Do you need him to come in then or come in now for labs?  Aslo, charge for no show?

## 2015-04-16 NOTE — Telephone Encounter (Signed)
Kristi, I have put an order in for his labs to be done before his appointment.  Please let him know and schedule a lab appt. Thanks,

## 2015-07-06 ENCOUNTER — Encounter: Payer: Non-veteran care | Admitting: Family Medicine

## 2015-07-06 DIAGNOSIS — Z0289 Encounter for other administrative examinations: Secondary | ICD-10-CM

## 2015-07-15 ENCOUNTER — Telehealth: Payer: Self-pay | Admitting: Family Medicine

## 2015-07-15 NOTE — Telephone Encounter (Signed)
Pt was no show 07/06/15 1:30pm, pt has not rescheduled, left msg for pt to call and reschedule & to notify of the no show policy, this is 2nd no show, charge or no charge?

## 2015-07-15 NOTE — Telephone Encounter (Signed)
charge 

## 2015-12-08 ENCOUNTER — Telehealth: Payer: Self-pay | Admitting: *Deleted

## 2015-12-08 NOTE — Telephone Encounter (Signed)
Pt signed ROI received, forwarded to [place] to scan/email to medical records. JG//CMA

## 2016-02-08 IMAGING — DX DG KNEE COMPLETE 4+V*R*
4 series · 4 of 4 positions shown · non-contrast
Comparison: February 20, 2013

CLINICAL DATA: Pain and limitation of motion for 4 days. No recent
trauma.

EXAM:
RIGHT KNEE - COMPLETE 4+ VIEW

[knee ap]
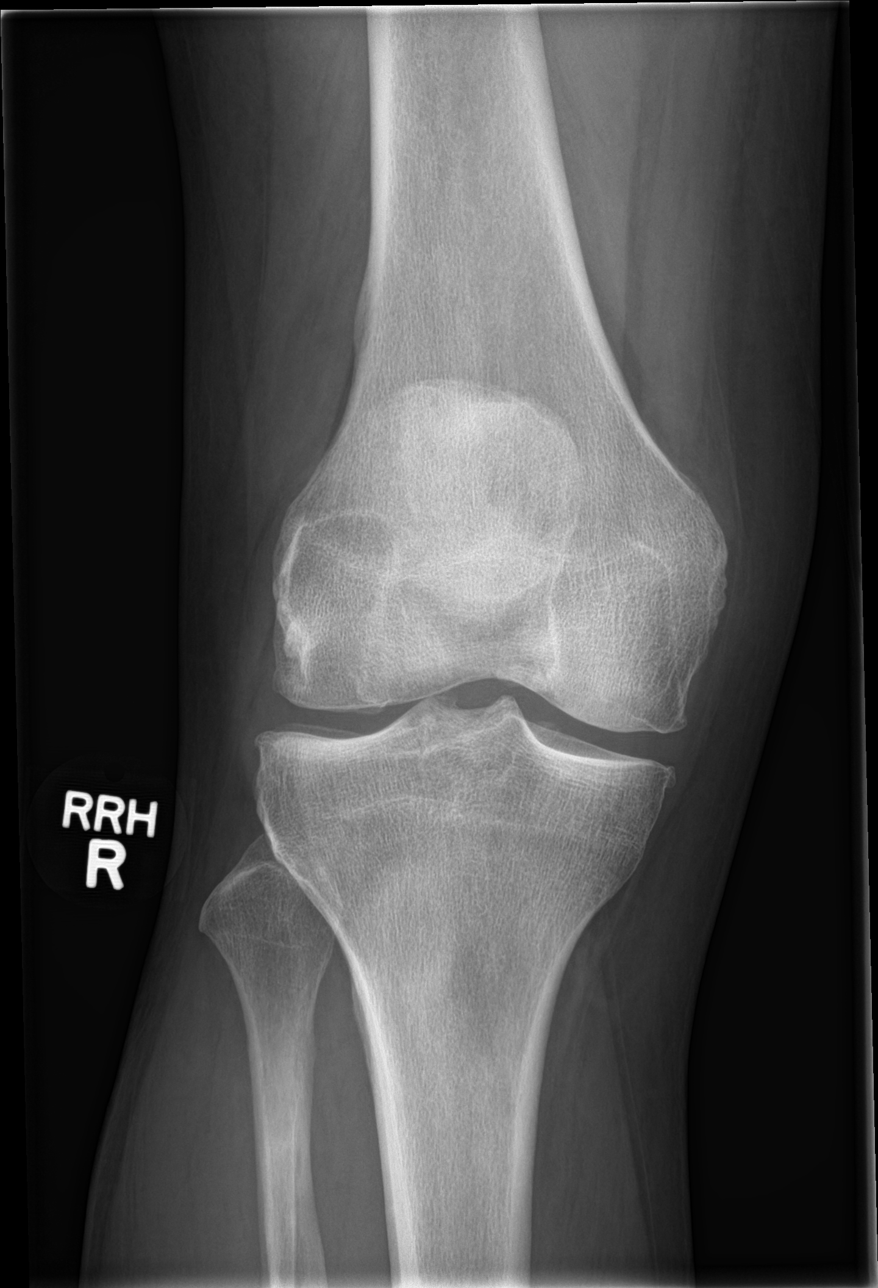

[knee obl (1 of 2)]
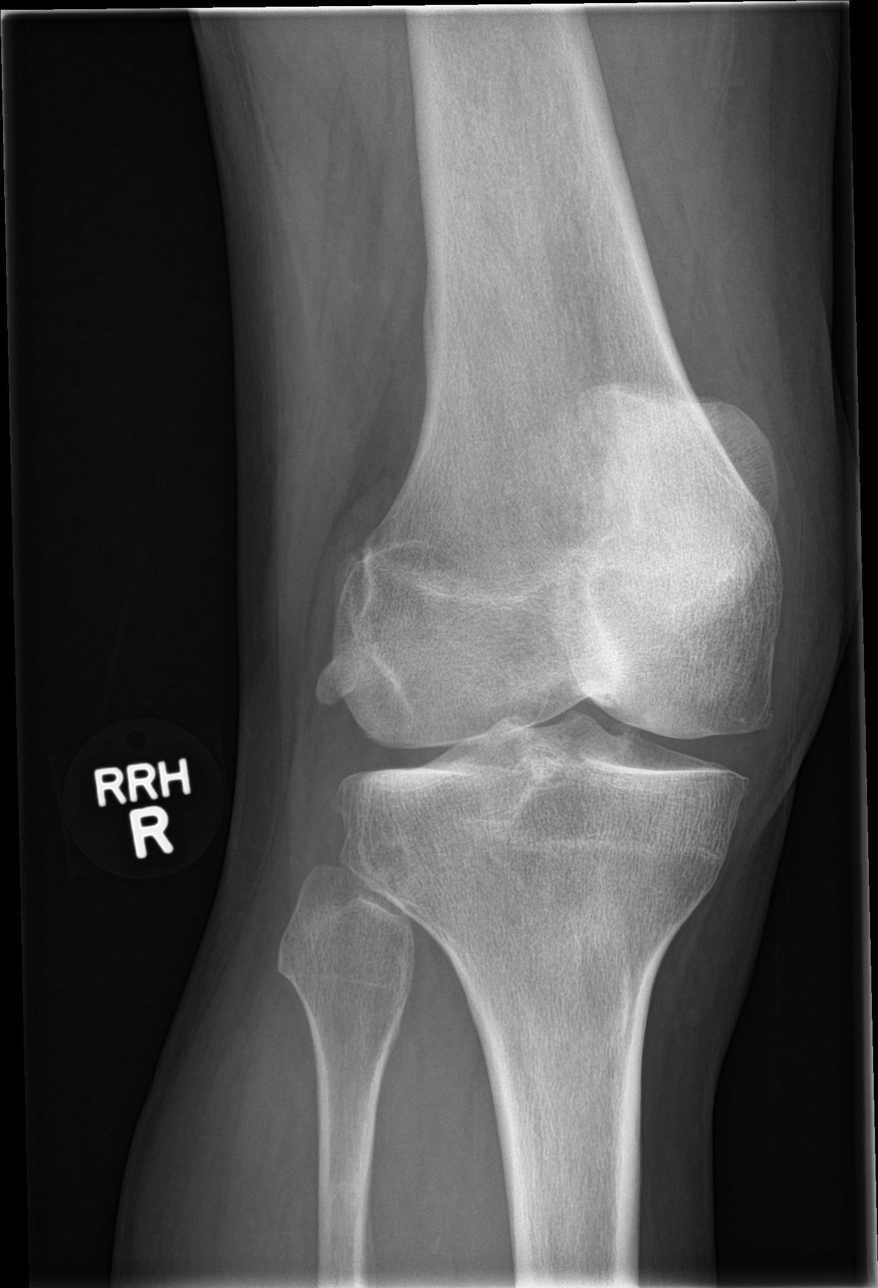

[knee obl (2 of 2)]
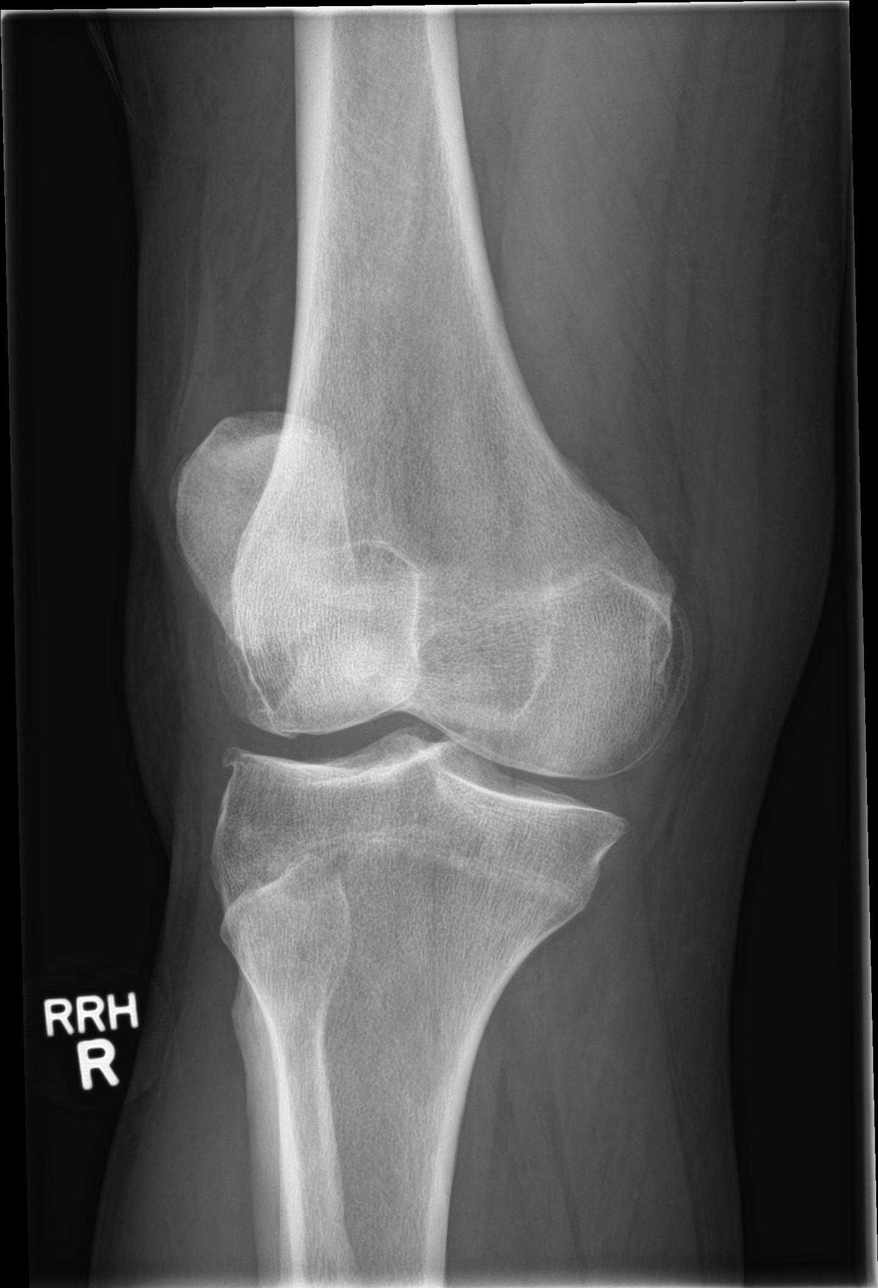

[knee lat]
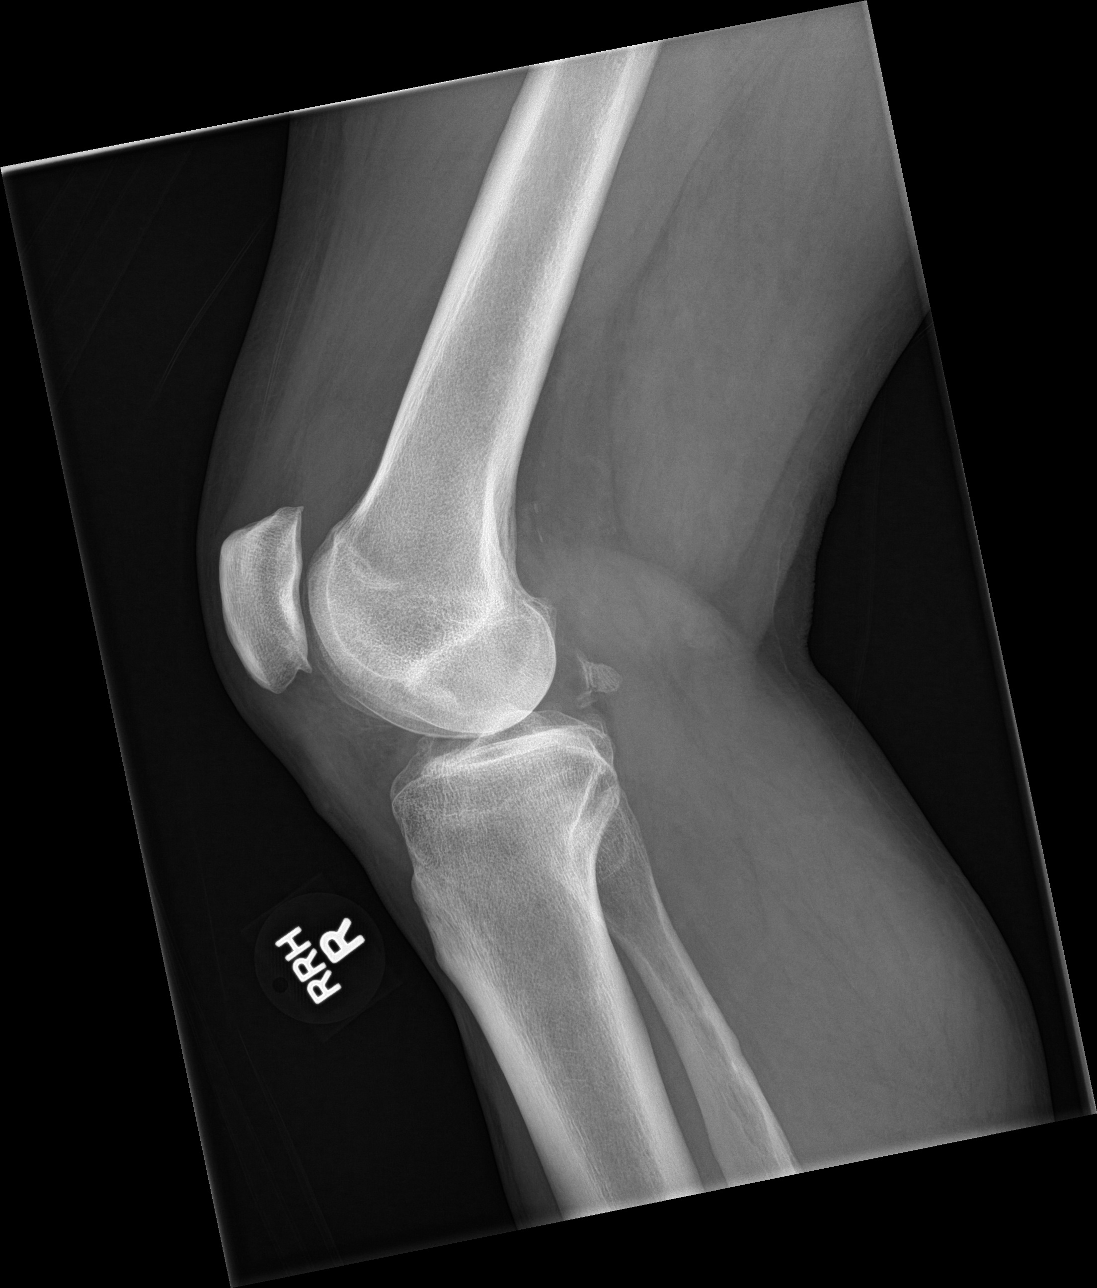

[4 of 4 positions shown; findings below may reference images not displayed]

FINDINGS: Frontal, lateral, and bilateral oblique views were obtained. There
is no fracture or dislocation. There is a small joint effusion.
There is slight spurring in all compartments. There is slight
narrowing of the patellofemoral joint. No erosive change.
IMPRESSION: Small joint effusion. No fracture or dislocation. Relatively mild
generalized osteoarthritic change.

## 2019-07-15 DEATH — deceased
